# Patient Record
Sex: Male | Born: 1955 | Race: White | Hispanic: No | Marital: Married | Smoking: Never smoker
Health system: Southern US, Community
[De-identification: ages and names within clinical notes are randomized; demographics above are authoritative.]

## PROBLEM LIST (undated history)

## (undated) DIAGNOSIS — I10 Essential (primary) hypertension: Secondary | ICD-10-CM

## (undated) DIAGNOSIS — N289 Disorder of kidney and ureter, unspecified: Secondary | ICD-10-CM

## (undated) HISTORY — PX: JOINT REPLACEMENT: SHX530

---

## 2008-07-01 ENCOUNTER — Ambulatory Visit: Payer: Self-pay | Admitting: Gastroenterology

## 2008-11-18 ENCOUNTER — Ambulatory Visit: Payer: Self-pay | Admitting: Specialist

## 2008-11-26 ENCOUNTER — Inpatient Hospital Stay: Payer: Self-pay | Admitting: Specialist

## 2011-02-26 ENCOUNTER — Ambulatory Visit: Payer: Self-pay | Admitting: Internal Medicine

## 2013-06-26 ENCOUNTER — Ambulatory Visit: Payer: Self-pay | Admitting: Family Medicine

## 2013-08-02 ENCOUNTER — Ambulatory Visit: Payer: Self-pay | Admitting: Internal Medicine

## 2013-08-28 ENCOUNTER — Ambulatory Visit: Payer: Self-pay | Admitting: Specialist

## 2013-08-28 LAB — URINALYSIS, COMPLETE
BACTERIA: NONE SEEN
Bilirubin,UR: NEGATIVE
Blood: NEGATIVE
Glucose,UR: NEGATIVE mg/dL (ref 0–75)
Ketone: NEGATIVE
Leukocyte Esterase: NEGATIVE
Nitrite: NEGATIVE
Ph: 5 (ref 4.5–8.0)
Protein: NEGATIVE
SPECIFIC GRAVITY: 1.027 (ref 1.003–1.030)
Squamous Epithelial: NONE SEEN

## 2013-08-28 LAB — CBC
HCT: 46 % (ref 40.0–52.0)
HGB: 15.3 g/dL (ref 13.0–18.0)
MCH: 28.9 pg (ref 26.0–34.0)
MCHC: 33.2 g/dL (ref 32.0–36.0)
MCV: 87 fL (ref 80–100)
Platelet: 339 10*3/uL (ref 150–440)
RBC: 5.28 10*6/uL (ref 4.40–5.90)
RDW: 13.7 % (ref 11.5–14.5)
WBC: 9.6 10*3/uL (ref 3.8–10.6)

## 2013-08-28 LAB — BASIC METABOLIC PANEL
Anion Gap: 8 (ref 7–16)
BUN: 24 mg/dL — ABNORMAL HIGH (ref 7–18)
CHLORIDE: 99 mmol/L (ref 98–107)
Calcium, Total: 9.1 mg/dL (ref 8.5–10.1)
Co2: 30 mmol/L (ref 21–32)
Creatinine: 1.32 mg/dL — ABNORMAL HIGH (ref 0.60–1.30)
EGFR (African American): >60
GFR CALC NON AF AMER: 59 — AB
Glucose: 102 mg/dL — ABNORMAL HIGH (ref 65–99)
Osmolality: 278 (ref 275–301)
Potassium: 3.7 mmol/L (ref 3.5–5.1)
SODIUM: 137 mmol/L (ref 136–145)

## 2013-08-28 LAB — PROTIME-INR
INR: 1
PROTHROMBIN TIME: 12.6 s (ref 11.5–14.7)

## 2013-08-28 LAB — MRSA PCR SCREENING

## 2013-09-12 ENCOUNTER — Inpatient Hospital Stay: Payer: Self-pay | Admitting: Specialist

## 2013-09-13 LAB — CBC WITH DIFFERENTIAL/PLATELET
BASOS PCT: 0.4 %
Basophil #: 0 10*3/uL (ref 0.0–0.1)
Eosinophil #: 0.1 10*3/uL (ref 0.0–0.7)
Eosinophil %: 2 %
HCT: 37.4 % — AB (ref 40.0–52.0)
HGB: 12.4 g/dL — AB (ref 13.0–18.0)
Lymphocyte #: 0.9 10*3/uL — ABNORMAL LOW (ref 1.0–3.6)
Lymphocyte %: 14.7 %
MCH: 28.4 pg (ref 26.0–34.0)
MCHC: 33.2 g/dL (ref 32.0–36.0)
MCV: 86 fL (ref 80–100)
MONO ABS: 0.8 x10 3/mm (ref 0.2–1.0)
MONOS PCT: 12.7 %
Neutrophil #: 4.4 10*3/uL (ref 1.4–6.5)
Neutrophil %: 70.2 %
Platelet: 229 10*3/uL (ref 150–440)
RBC: 4.37 10*6/uL — ABNORMAL LOW (ref 4.40–5.90)
RDW: 13.8 % (ref 11.5–14.5)
WBC: 6.2 10*3/uL (ref 3.8–10.6)

## 2013-09-13 LAB — BASIC METABOLIC PANEL
Anion Gap: 6 — ABNORMAL LOW (ref 7–16)
BUN: 12 mg/dL (ref 7–18)
CALCIUM: 8.3 mg/dL — AB (ref 8.5–10.1)
CREATININE: 1.17 mg/dL (ref 0.60–1.30)
Chloride: 99 mmol/L (ref 98–107)
Co2: 30 mmol/L (ref 21–32)
EGFR (African American): >60
Glucose: 112 mg/dL — ABNORMAL HIGH (ref 65–99)
Osmolality: 271 (ref 275–301)
POTASSIUM: 3.7 mmol/L (ref 3.5–5.1)
Sodium: 135 mmol/L — ABNORMAL LOW (ref 136–145)

## 2013-09-14 LAB — HEMOGLOBIN: HGB: 12.7 g/dL — ABNORMAL LOW (ref 13.0–18.0)

## 2013-09-14 LAB — PATHOLOGY REPORT

## 2014-04-27 NOTE — Op Note (Signed)
PATIENT NAME:  Kevin Petty, BISCHOF MR#:  [id number] DATE OF BIRTH:  25-Jan-1955  DATE OF PROCEDURE:  09/12/2013  PREOPERATIVE DIAGNOSIS: Severe osteoarthritis, right hip.   POSTOPERATIVE DIAGNOSIS:  Severe osteoarthritis, right hip.   PROCEDURE PERFORMED: Depuy Porocoat AML total hip replacement (19.5-mm narrow femur/58-mm series 300 cup/+4/10-mm lipped polyethylene liner/36-mm head/+1.5-mm neck).   SURGEON: Valinda Hoar, MD   ASSISTANTS:  Clare Gandy, MD and Geanie Cooley, PA, student.   ANESTHESIA: Spinal plus general endotracheal.   COMPLICATIONS: None.   DRAINS: Two Autovacs.   ESTIMATED BLOOD LOSS: 500 mL; replaced, none.   DESCRIPTION OF PROCEDURE: The patient was brought to the Operating Room where he underwent satisfactory spinal anesthesia with narcotics and then a general endotracheal anesthesia. Because of his size, he is 6 feet 5 inches, 325 pounds, general endotracheal anesthesia was also inserted. He was turned into the left lateral decubitus position and positioned and padded appropriately. The right hip was prepped and draped in standard sterile fashion. A posterolateral incision was made and dissection was carried out sharply through subcutaneous tissue and through to the fascia. Charnley retractor was inserted and electrocautery was used for hemostasis. The short external rotators were divided and tagged. The posterior capsule was divided and tagged. The femoral head was dislocated and was amputated with an oscillating saw. Examination of the head showed severe deformity especially in the inferior half.  The labrum was completely debrided. Retractors were inserted to expose the acetabulum. The acetabulum was then reamed up to 56 mm.  Trial 56 and 58 liners were inserted and the 58 fit well and snugly. This was removed and after irrigation a series 300, 58-mm, three-spiked cup was inserted in 50 degrees of abduction and 20 degrees of anteversion. The femoral canal  was then exposed and canal finder inserted. Reamers were then inserted up to 19 mm. A grasper inserted up to the 19.5 narrow rasp, which was quite snug. Calcar rasp was used to smooth off the calcar. A trial reduction was carried out with a +1.5 hip ball, which reduced well and was stable. He had been about 1.5 cm short preoperatively and he was almost back to normal leg lengths with this neck length.  There, it was stable. Therefore, the trials were removed and a hole eliminator inserted into the acetabulum. A polyethylene liner with a +4 depth and a 10-mm posterior lip was inserted with the build-up posteriorly and superiorly. The 19.5 femoral stem was then inserted and fit quite snugly. A trial reduction was carried out and the hip was stable except for extreme internal rotation and adduction. The trials were removed and 36-mm head with a + 1.5-mm neck length was inserted. We did try to trial a +5-mm neck length, but we were unable to reduce this so we kept the +1.5-mm neck length. Once the final components were reduced and were stable throughout the range of motion, then the wound was irrigated. The Autovacs were inserted. The posterior capsule was closed with #2 Ticron. The short external rotators were repaired with the same suture. The deep fascia was closed with a #2 Quill suture over one drain and the subcutaneous tissue was closed with 2-0 Quill over another drain with irrigation at each level. The skin was closed with staples. Dry sterile dressings and TENS pads were applied. The Autovac was activated. The patient was rolled onto his back, onto the hospital bed and leg lengths were seen to be virtually equal. The patient was awakened from anesthesia  and taken to recovery in good condition.    ____________________________ Valinda HoarHoward E. Kamare Caspers, MD hem:lr D: 09/12/2013 10:55:10 ET T: 09/12/2013 11:28:14 ET JOB#: [id number]  cc: Valinda HoarHoward E. Samuel Rittenhouse, MD, <Dictator> Valinda HoarHOWARD E Garhett Bernhard MD ELECTRONICALLY SIGNED  09/12/2013 12:43

## 2014-04-27 NOTE — H&P (Signed)
   Subjective/Chief Complaint Right hip pain   History of Present Illness 59 year old male with right hip arthritis refractory to conservative treatment for several years.  Has had NSAIDs and exercise witout relief.  Had successful left total hip replacement and wishes to proceed with right.  X-rays show arthritis with spus, sclerosisi, and narrowing.   Code Status Full Code   Past Med/Surgical Hx:  MRSA (currently infected):   Gout:   Hypertension:   Tonsillectomy:   ALLERGIES:  Penicillin: Rash  Family and Social History:  Family History Non-Contributory   Social History negative tobacco   Place of Living Home   Review of Systems:  Subjective/Chief Complaint recent cellulitis left leg now cleared.   Fever/Chills No   Cough No   Sputum No   Abdominal Pain No   Physical Exam:  GEN well developed, well nourished   HEENT pink conjunctivae   NECK supple   RESP normal resp effort   CARD regular rate   ABD denies tenderness   LYMPH negative neck   EXTR negative edema, right hip pain with rotation.  circulation/sensation/motor function good.  minimal shortening.   SKIN normal to palpation   NEURO motor/sensory function intact   PSYCH alert, A+O to time, place, person    Assessment/Admission Diagnosis right hip osteoarthritis   Plan right hip total hip replacement   Electronic Signatures: Valinda HoarMiller, Minnetta Sandora E (MD)  (Signed 08-Sep-15 16:03)  Authored: CHIEF COMPLAINT and HISTORY, PAST MEDICAL/SURGIAL HISTORY, ALLERGIES, HOME MEDICATIONS, FAMILY AND SOCIAL HISTORY, REVIEW OF SYSTEMS, PHYSICAL EXAM, ASSESSMENT AND PLAN   Last Updated: 08-Sep-15 16:03 by Valinda HoarMiller, Blaiden Werth E (MD)

## 2014-04-27 NOTE — Discharge Summary (Signed)
PATIENT NAME:  Kevin Petty, Kevin Petty MR#:  [id number] DATE OF BIRTH:  1955-04-29  DATE OF ADMISSION:  09/12/2013 DATE OF DISCHARGE:  09/15/2013   FINAL DIAGNOSES:  1. Advanced osteoarthritis, right hip.  2. History of cellulitis.  3. History of gout.  4. Hypertension.   OPERATIONS: On 09/12/2013, DePuy AML Porocoated right total hip replacement.   COMPLICATIONS: None.   CONSULTATIONS: None.   DISCHARGE MEDICATIONS:  1. Norco 7.5/325 p.r.n. pain.  2. Enteric-coated aspirin 1 p.o. b.i.d. for 6 weeks.  3. Meloxicam 15 mg daily. 4. Gabapentin 400 mg b.i.d.  5. Hydrochlorothiazide 12.5 mg daily. 6. Gemfibrozil 600 mg b.i.d.  7. Allopurinol 300 mg daily.   HISTORY OF PRESENT ILLNESS: The patient is a 59 year old male with advanced osteoarthritis of the right hip. He is in excruciating pain and is unable to perform normal daily activities. He has pain with sitting, standing and walking. He has been on anti-inflammatories for several years. The pain has progressed to a point where he requests hip replacement surgery. He has had a satisfactory hip replacement on the left side 5 years ago which has done well.   PAST MEDICAL HISTORY:   ILLNESSES: As above.   MEDICATIONS: As above.   ALLERGIES: PENICILLIN.   OPERATIONS: Left total hip and tonsillectomy.  REVIEW OF SYSTEMS: The patient had some cellulitis in his left leg last month, which has cleared completely. His MRSA nasal swab was negative.   SOCIAL HISTORY: The patient lives at home. He works for [employer].   PHYSICAL EXAMINATION:  GENERAL: This is a pleasant, healthy, alert large male in no distress.  VITAL SIGNS: He is 6 feet 3 inches tall, 325.3 pounds.  HEART AND LUNGS: Normal.  MUSCULOSKELETAL: The left hip shows a well-healed incision with excellent range of motion of the hip without pain. Leg lengths show about 1.5 cm shortening on the right. He has severe pain with right hip range of motion. He has very limited range  of motion with internal rotation of 5 degrees, external rotation of 15 degrees. Neurovascular status was good distally.   LABORATORY DATA ON ADMISSION: Satisfactory.   HOSPITAL COURSE: On 09/12/2013, the patient underwent satisfactory Porocoated AML right total hip replacement. Postoperatively, he did very well with no significant problems. His hemoglobin was 12.7 on the second postop day. His pain was markedly improved. He was ambulatory and ready for discharge on 09/15/2013. He will get home health PT for 2 weeks and then be seen in my office for exam and x-rays. He will remain out of work.   ____________________________ Valinda HoarHoward E. Akeem Heppler, MD hem:lb D: 09/15/2013 09:54:04 ET T: 09/15/2013 10:28:08 ET JOB#: [id number]  cc: Valinda HoarHoward E. Burdette Gergely, MD, <Dictator> John B. Danne HarborWalker III, MD Valinda HoarHOWARD E Fynlee Rowlands MD ELECTRONICALLY SIGNED 09/15/2013 11:49

## 2014-10-08 IMAGING — CT CT PELVIS W/ CM
2 of 3 series · 17 of 46 positions shown, 19 images · IV contrast (isovue)
Comparison: Ultrasound June 26, 2013.

CLINICAL DATA: Left groin cellulitis.

EXAM:
CT PELVIS WITH CONTRAST
TECHNIQUE: Multidetector CT imaging of the pelvis was performed using the
standard protocol following the bolus administration of intravenous
contrast.
CONTRAST:  100 mL of Isovue 370 intravenously.

[Series 2: routine pel with · axial · 0.96mm/px · z∈[-1314,-934]mm · 14 of 88 slices shown, 16 images]
[im 6/88  soft-tissue]
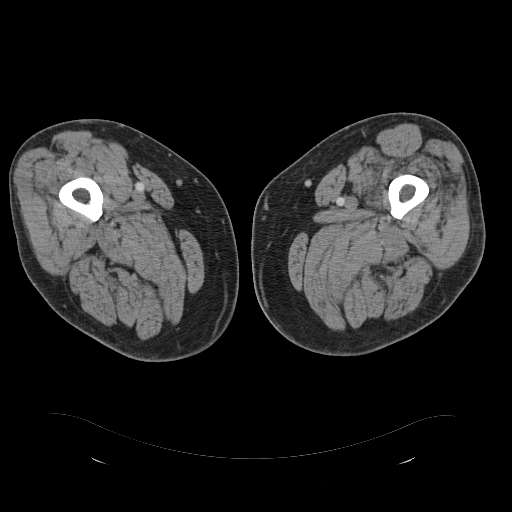
[im 6/88  bone]
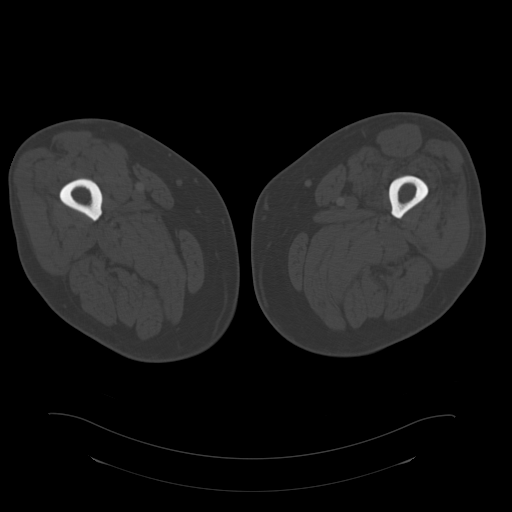
[im 12/88  soft-tissue]
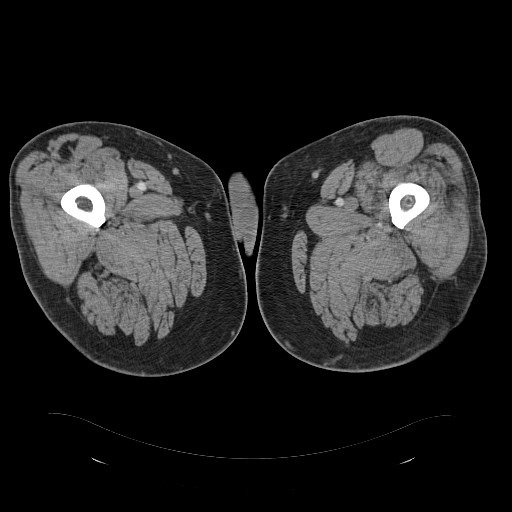
[im 17/88  soft-tissue]
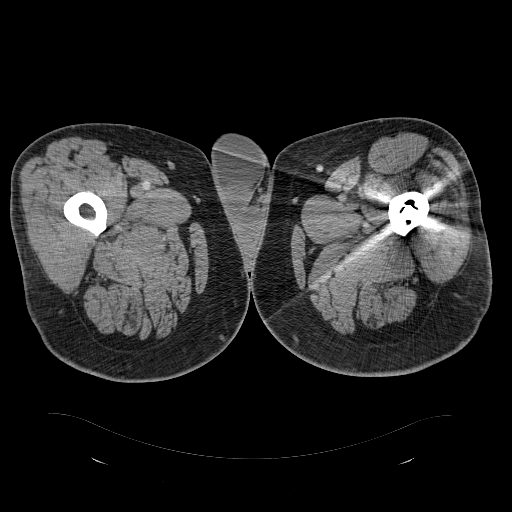
[im 23/88  soft-tissue]
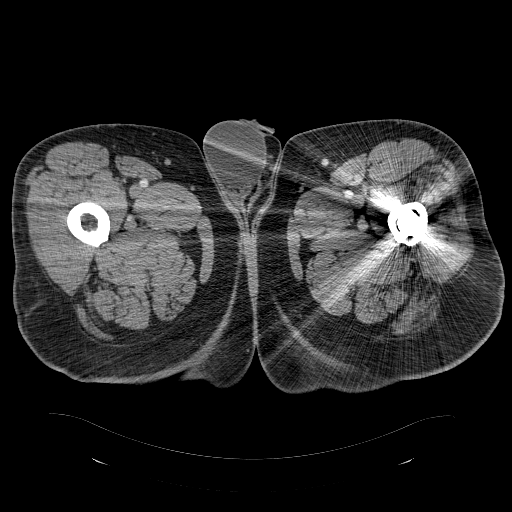
[im 29/88  soft-tissue]
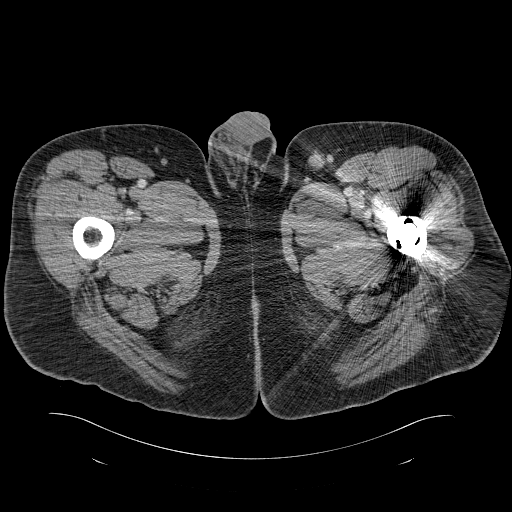
[im 34/88  soft-tissue]
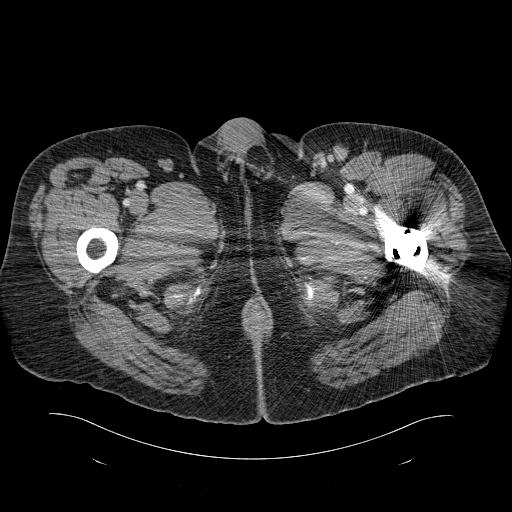
[im 40/88  soft-tissue]
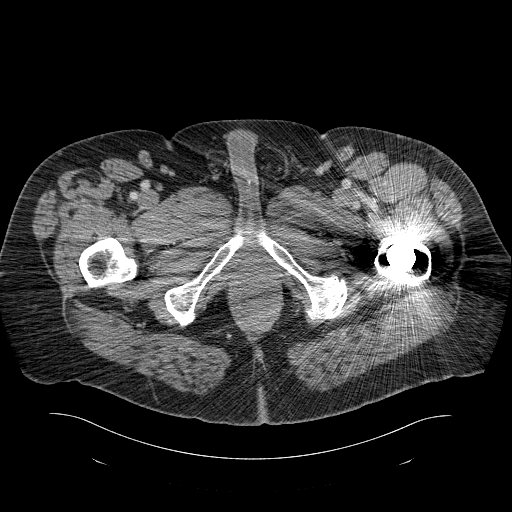
[im 48/88  soft-tissue]
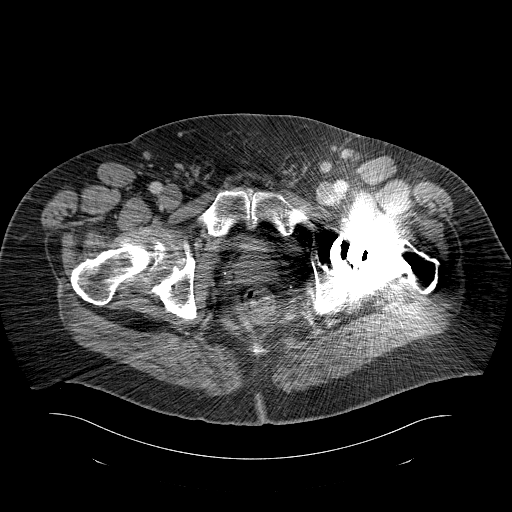
[im 54/88  soft-tissue]
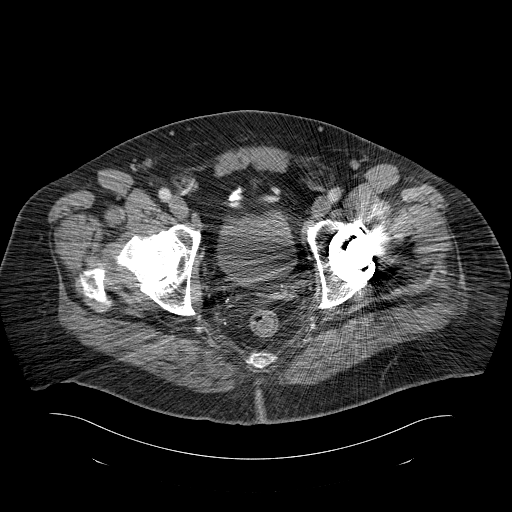
[im 54/88  bone]
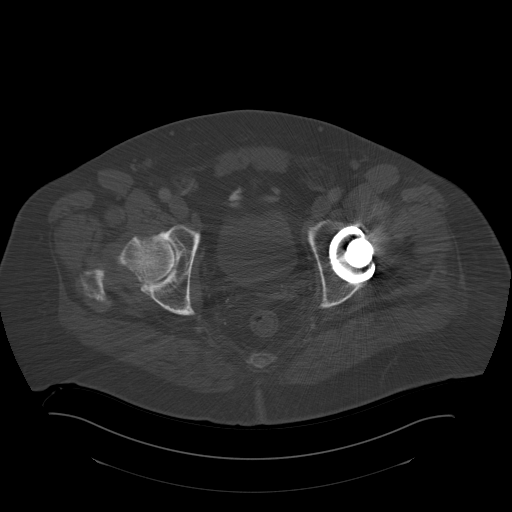
[im 59/88  soft-tissue]
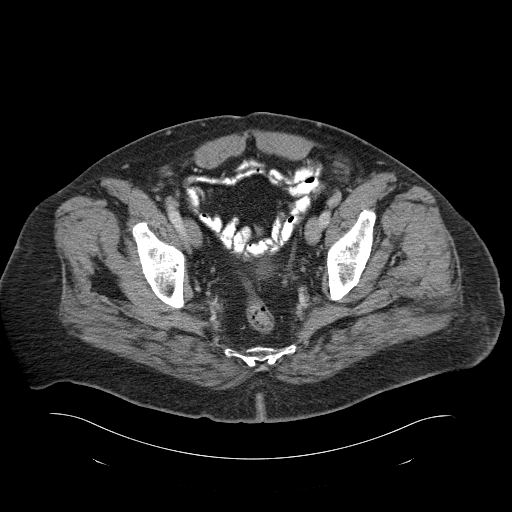
[im 65/88  soft-tissue]
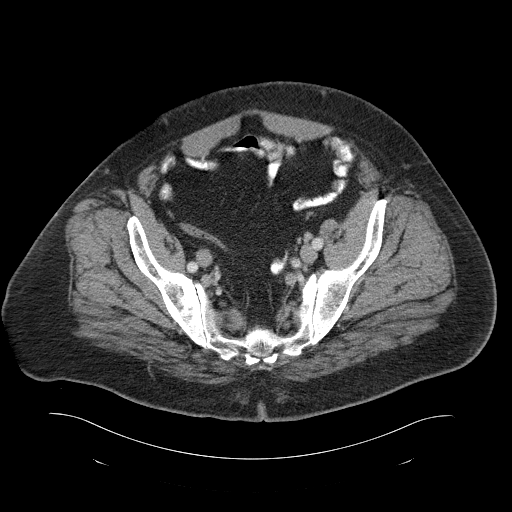
[im 71/88  soft-tissue]
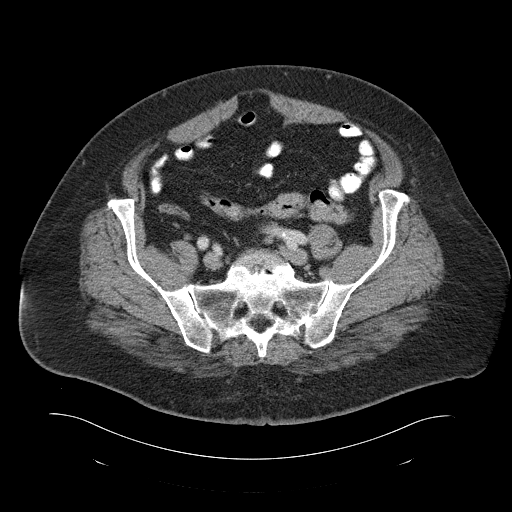
[im 76/88  soft-tissue]
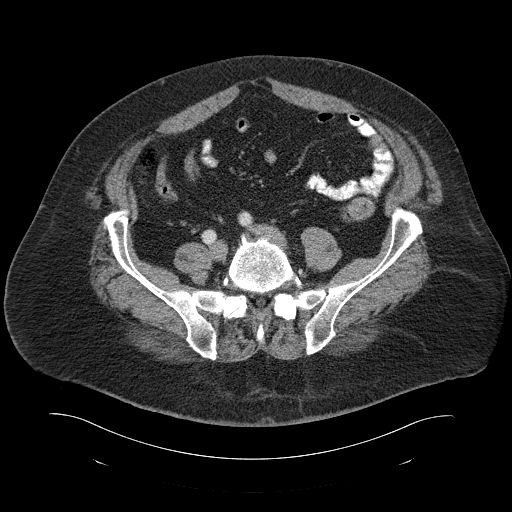
[im 82/88  soft-tissue]
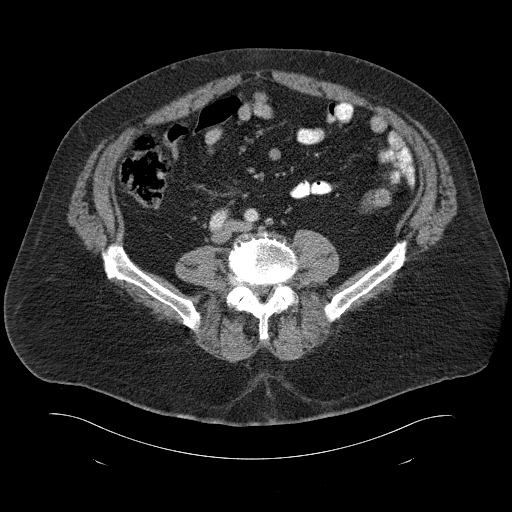

[Series 4: cor routine pel with · coronal · 0.88mm/px · 3 of 171 slices shown]
[im 57/171  soft-tissue]
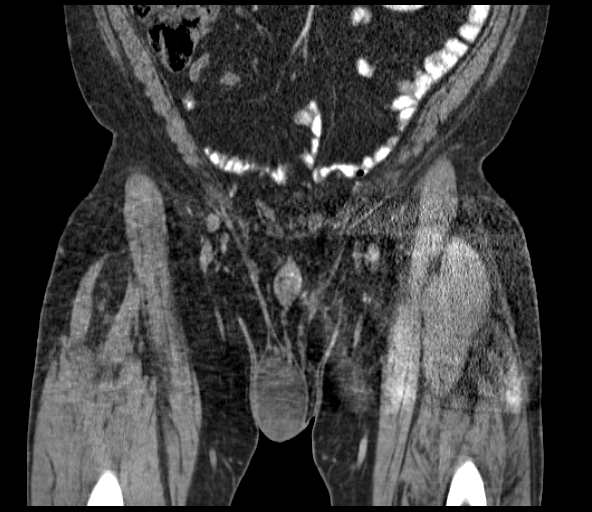
[im 76/171  soft-tissue]
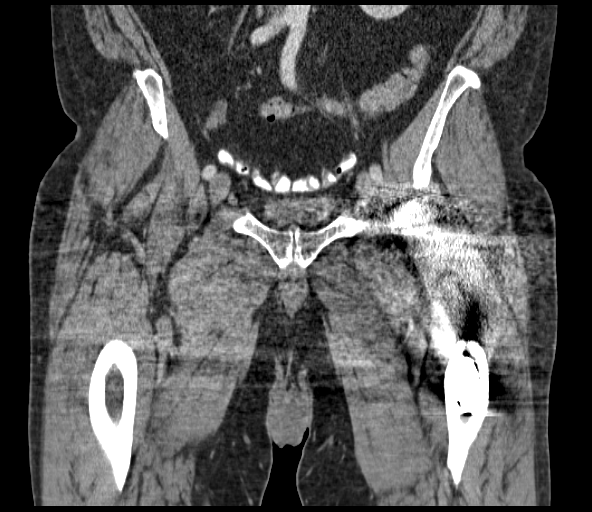
[im 95/171  soft-tissue]
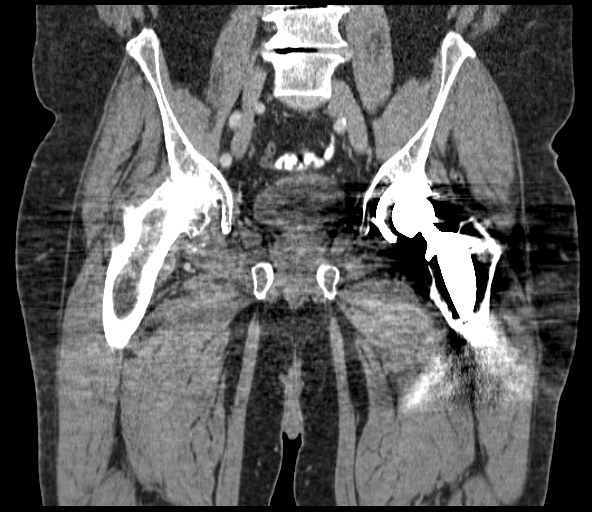

[17 of 46 positions shown; findings below may reference images not displayed]

FINDINGS: Status post left total hip arthroplasty which generates significant
scatter artifact and obscures evaluation of the left side of the
pelvis. Severe degenerative disc disease is noted at L4-5. Severe
narrowing of the right hip joint is noted with probable avascular
necrosis seen in the right femoral head. Mild fat containing left
inguinal hernia is noted. No definite bowel abnormality is noted. No
abnormal fluid collection is noted. Urinary bladder appears normal.
Enlarged bilateral inguinal lymph nodes are noted with the largest
measuring 27 x 20 mm in the left inguinal region. These most likely
are inflammatory in origin, but neoplasm cannot be excluded. No
definite abscess is noted.
IMPRESSION: Severe degenerative joint disease of right hip is noted with
probable avascular necrosis noted in right femoral head.

Enlarged bilateral inguinal lymph nodes are noted with the largest
measuring 2.7 x 2.0 cm on the left side. These most likely are
inflammatory in origin, but neoplasm cannot be excluded. As
mentioned on prior ultrasound report, biopsy may be considered.

No abscess is noted.

## 2014-11-18 IMAGING — CR PELVIS - 1-2 VIEW
1 series · 2 of 2 positions shown · non-contrast
Comparison: None.

CLINICAL DATA: Postop right hip replacement

EXAM:
PELVIS - 1-2 VIEW; RIGHT HIP - 1 VIEW

[Series 1: ap · 0.17mm/px · 2 of 2 slices shown]
[im 1/2]
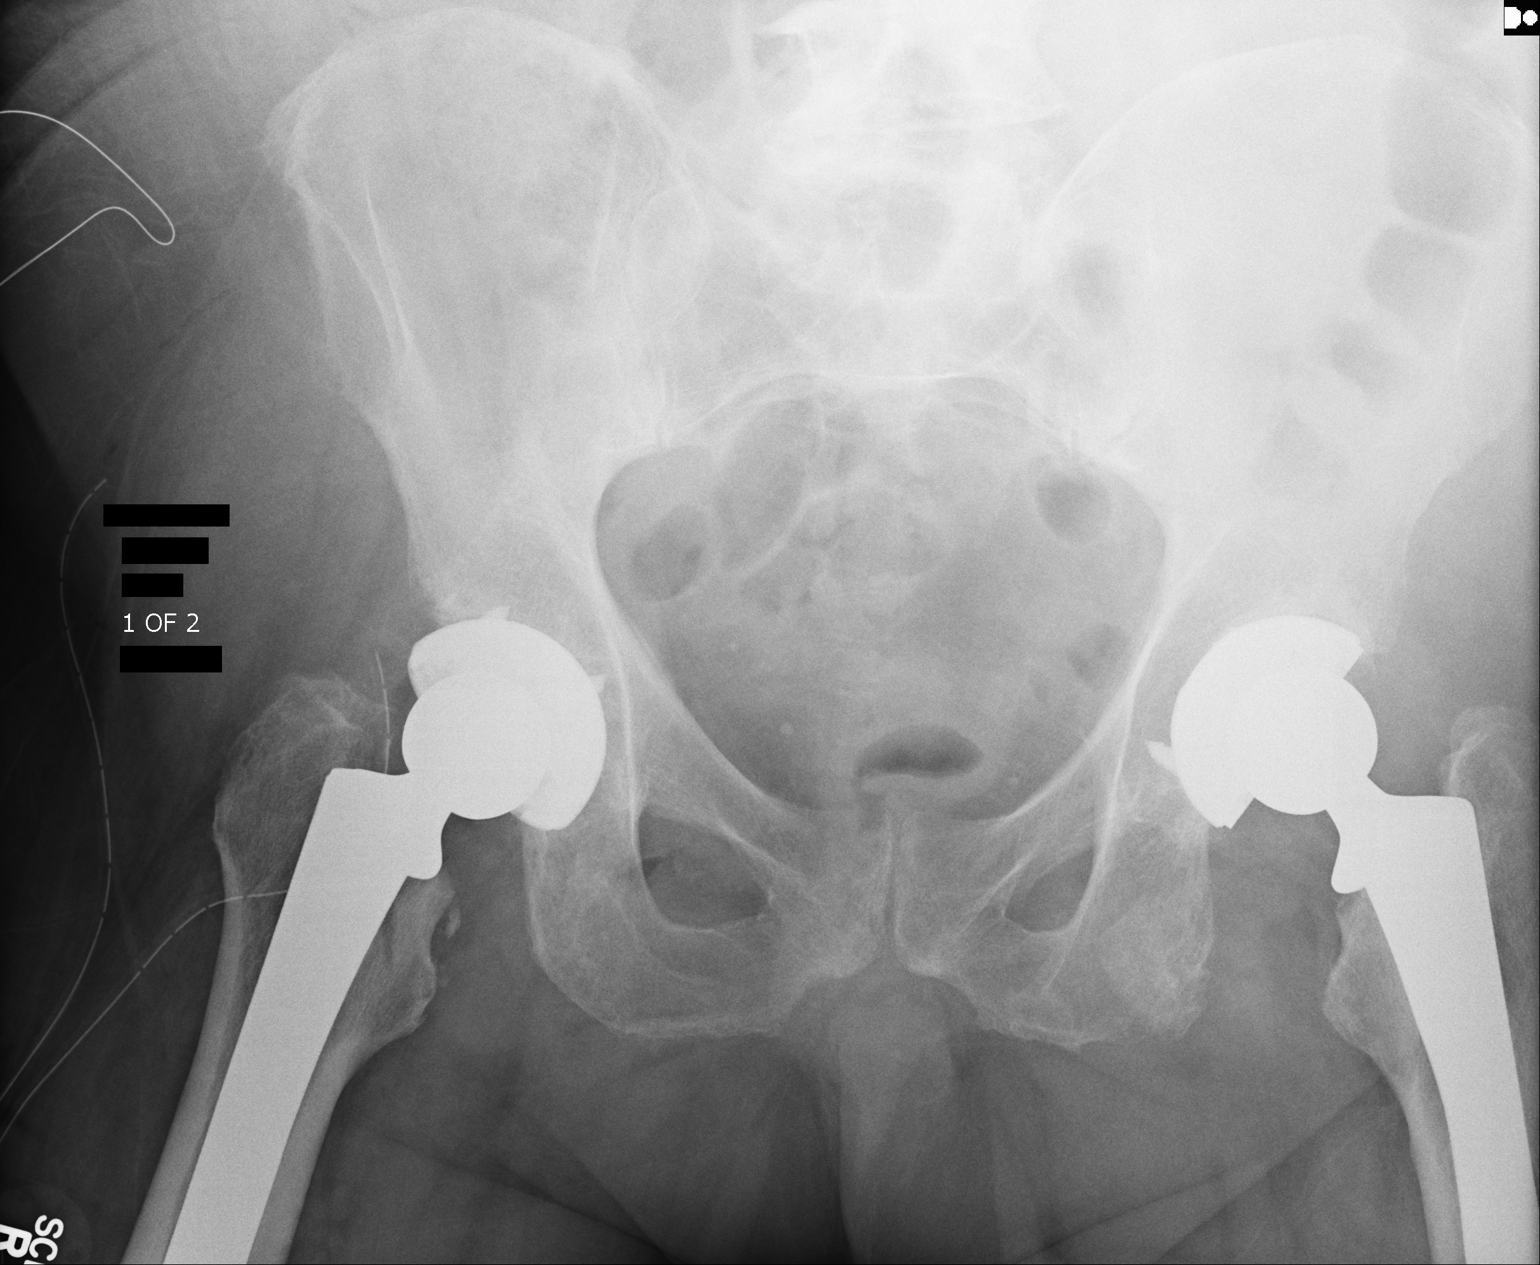
[im 2/2]
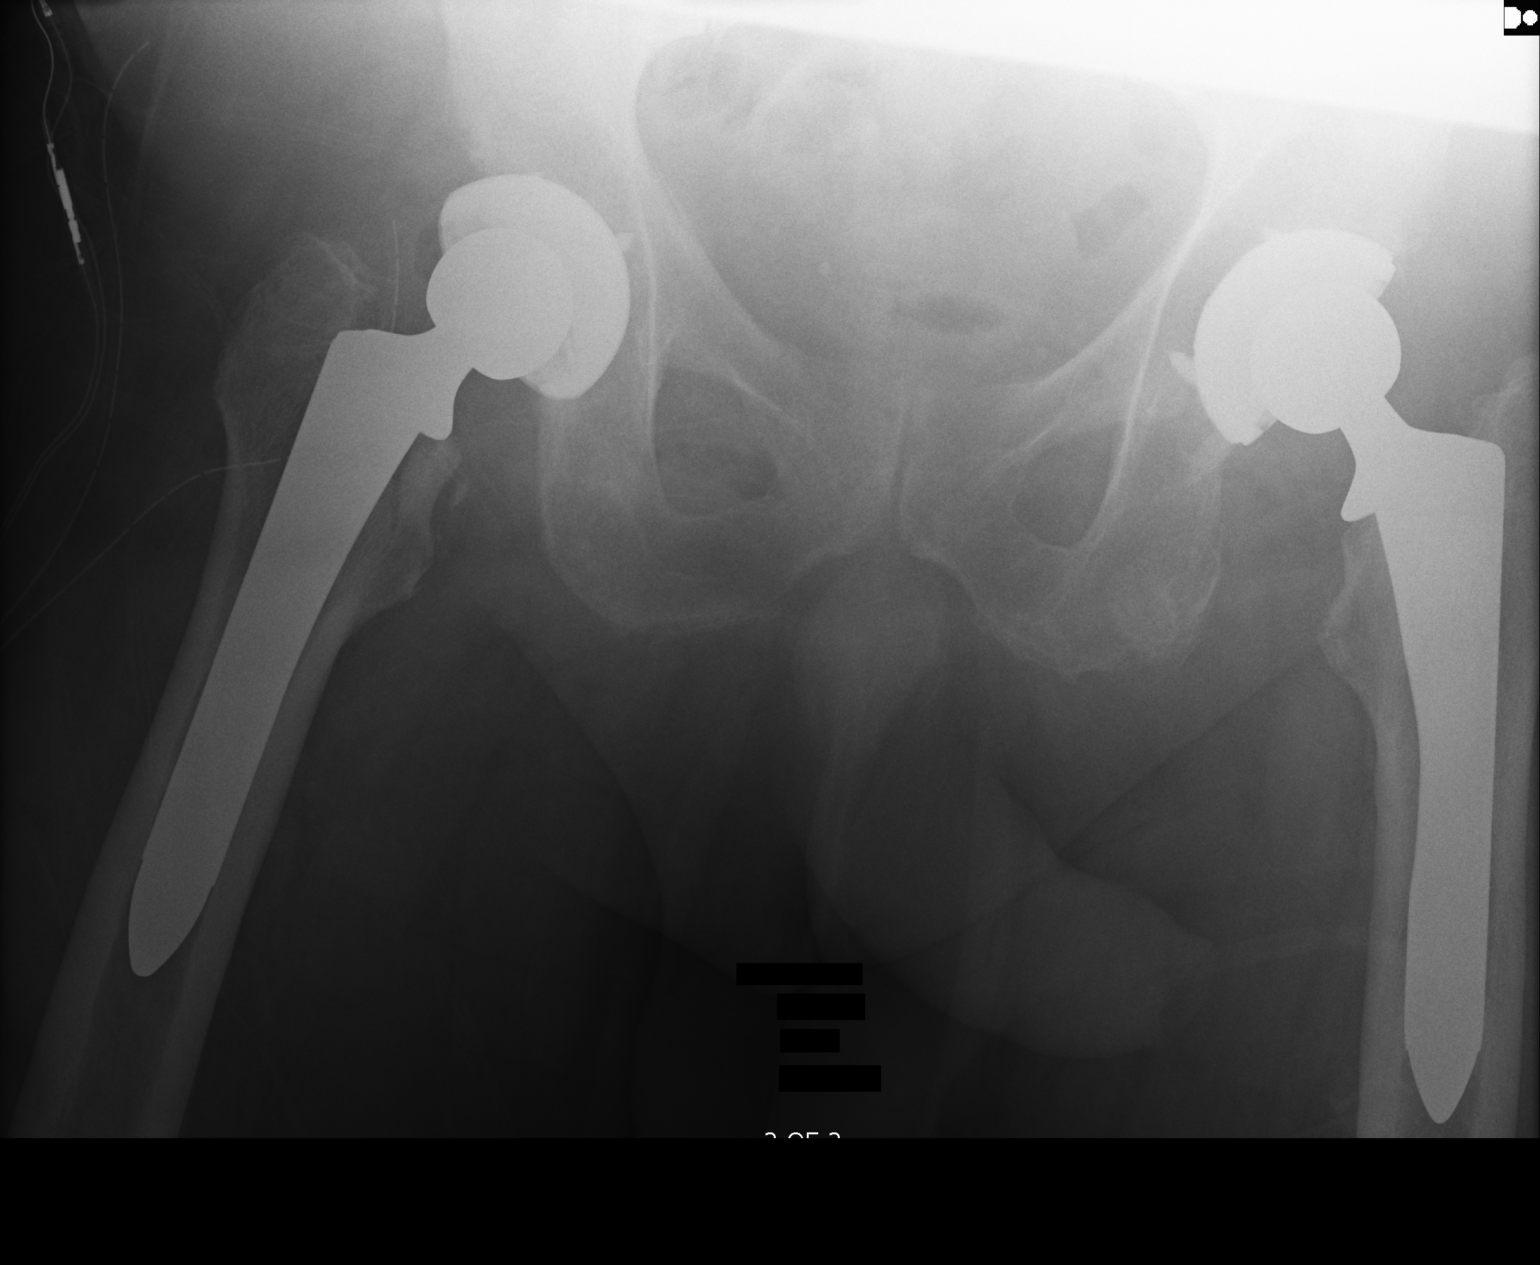

[2 of 2 positions shown; findings below may reference images not displayed]

FINDINGS: Interval right hip arthroplasty without failure or complication. No
fracture or dislocation. Postsurgical changes in the surrounding
soft tissues with surgical drains present.

Left hip arthroplasty without failure or complication.
IMPRESSION: Right hip arthroplasty without failure or complication.

## 2018-08-13 ENCOUNTER — Emergency Department: Payer: BC Managed Care – PPO

## 2018-08-13 ENCOUNTER — Encounter: Payer: Self-pay | Admitting: Emergency Medicine

## 2018-08-13 ENCOUNTER — Other Ambulatory Visit: Payer: Self-pay

## 2018-08-13 ENCOUNTER — Emergency Department
Admission: EM | Admit: 2018-08-13 | Discharge: 2018-08-13 | Disposition: A | Discharge status: Home / Self Care | Payer: BC Managed Care – PPO | Attending: Emergency Medicine | Admitting: Emergency Medicine

## 2018-08-13 DIAGNOSIS — S86891A Other injury of other muscle(s) and tendon(s) at lower leg level, right leg, initial encounter: Secondary | ICD-10-CM | POA: Diagnosis not present

## 2018-08-13 DIAGNOSIS — S80911A Unspecified superficial injury of right knee, initial encounter: Secondary | ICD-10-CM | POA: Diagnosis present

## 2018-08-13 DIAGNOSIS — Y939 Activity, unspecified: Secondary | ICD-10-CM | POA: Insufficient documentation

## 2018-08-13 DIAGNOSIS — Y929 Unspecified place or not applicable: Secondary | ICD-10-CM | POA: Diagnosis not present

## 2018-08-13 DIAGNOSIS — W109XXA Fall (on) (from) unspecified stairs and steps, initial encounter: Secondary | ICD-10-CM | POA: Insufficient documentation

## 2018-08-13 DIAGNOSIS — Y999 Unspecified external cause status: Secondary | ICD-10-CM | POA: Insufficient documentation

## 2018-08-13 DIAGNOSIS — M25561 Pain in right knee: Secondary | ICD-10-CM

## 2018-08-13 HISTORY — DX: Essential (primary) hypertension: I10

## 2018-08-13 MED ORDER — TRAMADOL HCL 50 MG PO TABS: 50.0000 mg | ORAL_TABLET | Freq: Four times a day (QID) | ORAL | 0 refills | Status: DC | PRN

## 2018-08-13 NOTE — Consult Note (Signed)
ORTHOPAEDIC CONSULTATION  REQUESTING PHYSICIAN: Shaune PollackIsaacs, Cameron, MD  Chief Complaint: Right knee pain  HPI: Called by Greig RightSusan Fisher, PA regarding Mr.Donnita FallsOsburne M Bencivenga, a 63 y.o. male who complains of right knee pain and difficulty extending his knee after he slipped down several stairs today.  I reviewed x-rays which showed advanced tricompartmental osteoarthritis.  Patient has stated he has a history of a patella fracture.  On today's x-ray the lateral view shows calcification proximal to the patella which does not appear to be a fracture of the patella itself but may be an avulsion of an osteophyte consistent with a quadriceps tendon tear.  I have recommended MRI evaluation of the right knee.  The patient will then be placed in a knee immobilizer.  He may weight-bear on the right lower extremity using crutches or a walker and will follow-up with me in the office early next week to review the MRI results.  If a quadriceps rupture is confirmed, surgery can be arranged during his office visit.    Past Medical History:  Diagnosis Date  . Hypertension     Social History   Socioeconomic History  . Marital status: Married    Spouse name: Not on file  . Number of children: Not on file  . Years of education: Not on file  . Highest education level: Not on file  Occupational History  . Not on file  Social Needs  . Financial resource strain: Not on file  . Food insecurity    Worry: Not on file    Inability: Not on file  . Transportation needs    Medical: Not on file    Non-medical: Not on file  Tobacco Use  . Smoking status: Never Smoker  . Smokeless tobacco: Never Used  Substance and Sexual Activity  . Alcohol use: Not on file  . Drug use: Not on file  . Sexual activity: Not on file  Lifestyle  . Physical activity    Days per week: Not on file    Minutes per session: Not on file  . Stress: Not on file  Relationships  . Social Musicianconnections    Talks on phone: Not on file    Gets  together: Not on file    Attends religious service: Not on file    Active member of club or organization: Not on file    Attends meetings of clubs or organizations: Not on file    Relationship status: Not on file  Other Topics Concern  . Not on file  Social History Narrative  . Not on file   No family history on file. Allergies  Allergen Reactions  . Penicillins Rash   Prior to Admission medications   Medication Sig Start Date End Date Taking? Authorizing Provider  traMADol (ULTRAM) 50 MG tablet Take 1 tablet (50 mg total) by mouth every 6 (six) hours as needed. 08/13/18   Faythe GheeFisher, Susan W, PA-C   Dg Knee Complete 4 Views Right  Result Date: 08/13/2018 CLINICAL DATA:  Knee pain and swelling post fall. EXAM: RIGHT KNEE - COMPLETE 4+ VIEW COMPARISON:  None. FINDINGS: No evidence of fracture, or dislocation or sizable joint effusion. Prepatellar/suprapatellar soft tissue swelling. 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 IMPRESSION: 1. No acute fracture or dislocation identified about the right Knee. 2. Prepatellar/suprapatellar soft tissue swelling. 3. Three compartment moderate to advanced arthritic changes. 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 Electronically Signed  By: Fidela Salisbury M.D.   On: 08/13/2018 13:53       Thornton Park, MD    08/13/2018 2:10 PM

## 2018-08-13 NOTE — ED Provider Notes (Addendum)
[facility] Emergency Department Provider Note  ____________________________________________   First MD Initiated Contact with Patient 08/13/18 1213     (approximate)  I have reviewed the triage vital signs and the nursing notes.   HISTORY  Chief Complaint Knee Pain    HPI Kevin Petty is a 63 y.o. male presents emergency department complaining of right knee pain after a fall when he slipped down several steps.  He states it is difficult to extend his right leg.  He is able to bear weight but causes pain.  States he felt something snap.  He thinks he tore a tendon or ligament.  He states he has had a break at the patella before.  He is unsure of what magnitude the fracture was.  [facility] is a patient at [facility].    Past Medical History:  Diagnosis Date  . Hypertension     There are no active problems to display for this patient.     Prior to Admission medications   Medication Sig Start Date End Date Taking? Authorizing Provider  traMADol (ULTRAM) 50 MG tablet Take 1 tablet (50 mg total) by mouth every 6 (six) hours as needed. 08/13/18   Faythe GheeFisher,  W, PA-C    Allergies Penicillins  No family history on file.  Social History Social History   Tobacco Use  . Smoking status: Never Smoker  . Smokeless tobacco: Never Used  Substance Use Topics  . Alcohol use: Not on file  . Drug use: Not on file    Review of Systems  Constitutional: No fever/chills Eyes: No visual changes. ENT: No sore throat. Respiratory: Denies cough Genitourinary: Negative for dysuria. Musculoskeletal: Negative for back pain.  Positive for right knee pain Skin: Negative for rash.    ____________________________________________   PHYSICAL EXAM:  VITAL SIGNS: ED Triage Vitals  Enc Vitals Group     BP 08/13/18 1204 (!) 144/73     Pulse Rate 08/13/18 1204 74     Resp 08/13/18 1204 18     Temp 08/13/18 1204 98.6 F (37 C)     Temp Source  08/13/18 1204 Oral     SpO2 08/13/18 1204 98 %     Weight 08/13/18 1203 (!) 305 lb (138.3 kg)     Height 08/13/18 1203 6\' 3"  (1.905 m)     Head Circumference --      Peak Flow --      Pain Score 08/13/18 1203 5     Pain Loc --      Pain Edu? --      Excl. in GC? --     Constitutional: Alert and oriented. Well appearing and in no acute distress. Eyes: Conjunctivae are normal.  Head: Atraumatic. Nose: No congestion/rhinnorhea. Mouth/Throat: Mucous membranes are moist.   Neck:  supple no lymphadenopathy noted Cardiovascular: Normal rate, regular rhythm.  Respiratory: Normal respiratory effort.  No retractions, GU: deferred Musculoskeletal: Decreased range of motion of the right knee.  Patient has difficulty with extension.  The medial aspect of the right knee is tender to palpation.  Neurovascular is intact.   Neurologic:  Normal speech and language.  Skin:  Skin is warm, dry and intact. No rash noted. Psychiatric: Mood and affect are normal. Speech and behavior are normal.  ____________________________________________   LABS (all labs ordered are listed, but only abnormal results are displayed)  Labs Reviewed - No data to display ____________________________________________   ____________________________________________  RADIOLOGY  X-ray of the right knee  does not show an acute abnormality MRI of the right knee ____________________________________________   PROCEDURES  Procedure(s) performed: Knee immobilizer applied by nursing staff  Procedures    ____________________________________________   INITIAL IMPRESSION / ASSESSMENT AND PLAN / ED COURSE  Pertinent labs & imaging results that were available during my care of the patient were reviewed by me and considered in my medical decision making (see chart for details).   Patient 63 year old male presents emergency department complaint of right knee pain after a fall.  Physical exam shows the right knee to be  swollen and tender at the medial aspect, decreased range of motion with extension.  Neurovascular intact.  X-ray of the right knee does not show an acute abnormality per the radiologist, however the top part of the patella appears to be avulsed off.  Paged Dr. Mack Guise.  Explained the situation where the patient is having difficulty extending his right leg.  He states to put the patient in a knee immobilizer.  He has not take the knee immobilizer off while trying to bear weight.  He should wear this at all times.  He is to follow-up in his office this week.  Pain medication as needed.  Elevate and ice.  Explained all of this to the patient.  He agrees to treatment plan.  He is given prescription for tramadol.  He is to take Tylenol and ibuprofen for pain as needed.  Tramadol for pain not controlled by the other over-the-counter medications.  And he will call Dr. Harden Mo office tomorrow morning for an appointment this week.  He was discharged in stable condition with strict instructions to not bear weight on the right leg unless he has an knee immobilizer on.  He states he understands.    Kevin Petty was evaluated in Emergency Department on 08/13/2018 for the symptoms described in the history of present illness. He was evaluated in the context of the global COVID-19 pandemic, which necessitated consideration that the patient might be at risk for infection with the SARS-CoV-2 virus that causes COVID-19. Institutional protocols and algorithms that pertain to the evaluation of patients at risk for COVID-19 are in a state of rapid change based on information released by regulatory bodies including the CDC and federal and state organizations. These policies and algorithms were followed during the patient's care in the ED. ----------------------------------------- 2:15 PM on 08/13/2018 -----------------------------------------  Dr. Mack Guise called back and like for [place] to do the MRI today.  MRI of  the right knee is ordered. Patient returned from MRI and is requesting to leave as he states the results would be and in 1 and half hours per the tech.  Explained to him we could discharge him and I can call him back with the results.   As part of my medical decision making, I reviewed the following data within the [facility] notes reviewed and incorporated, Old chart reviewed, Radiograph reviewed x-ray of the right knee does not show any acute abnormality, A consult was requested and obtained from this/these consultant(s) Orthopedics, Notes from prior ED visits and [place] Controlled Substance Database  ____________________________________________   FINAL CLINICAL IMPRESSION(S) / ED DIAGNOSES  Final diagnoses:  Avulsion of right patellar tendon, initial encounter  Acute pain of right knee      NEW MEDICATIONS STARTED DURING THIS VISIT:  Discharge Medication List as of 08/13/2018  2:01 PM    START taking these medications   Details  traMADol (ULTRAM) 50 MG tablet Take 1  tablet (50 mg total) by mouth every 6 (six) hours as needed., Starting Sun 08/13/2018, Normal         Note:  This document was prepared using Dragon voice recognition software and may include unintentional dictation errors.    Faythe GheeFisher,  W, PA-C 08/13/18 1403    Faythe GheeFisher,  W, PA-C 08/13/18 1621    Shaune PollackIsaacs, Cameron, MD 08/19/18 1046

## 2018-08-13 NOTE — Discharge Instructions (Addendum)
Wear the knee immobilizer at all times when trying to bear weight on your right leg.  Apply ice to the right knee.  Take Tylenol and ibuprofen for pain as needed.  Tramadol for pain not controlled by Tylenol or ibuprofen.  Call Dr. Harden Mo office tomorrow for an appointment.  Return to the emergency department if worsening.  Use your cane and/or walker to help give you support.

## 2018-08-13 NOTE — ED Triage Notes (Signed)
Pt arrived via POV with wife reports right knee pain after fall when he slipped. Pt reports it is difficult to walk on and bend.    Pt using cane in triage, walking with limp.

## 2018-08-15 ENCOUNTER — Other Ambulatory Visit: Payer: Self-pay | Admitting: Orthopedic Surgery

## 2018-08-15 DIAGNOSIS — S76191A Other specified injury of right quadriceps muscle, fascia and tendon, initial encounter: Secondary | ICD-10-CM

## 2018-08-21 ENCOUNTER — Other Ambulatory Visit
Admission: RE | Admit: 2018-08-21 | Discharge: 2018-08-21 | Disposition: A | Discharge status: Home / Self Care | Payer: BC Managed Care – PPO | Source: Ambulatory Visit | Attending: Orthopedic Surgery | Admitting: Orthopedic Surgery

## 2018-08-21 ENCOUNTER — Other Ambulatory Visit: Payer: Self-pay | Admitting: Orthopedic Surgery

## 2018-08-21 ENCOUNTER — Other Ambulatory Visit: Payer: Self-pay

## 2018-08-21 DIAGNOSIS — Z20828 Contact with and (suspected) exposure to other viral communicable diseases: Secondary | ICD-10-CM | POA: Diagnosis not present

## 2018-08-21 DIAGNOSIS — Z01812 Encounter for preprocedural laboratory examination: Secondary | ICD-10-CM | POA: Insufficient documentation

## 2018-08-21 MED ORDER — VANCOMYCIN HCL 10 G IV SOLR
1500.0000 mg | INTRAVENOUS | Status: AC
  Administered 2018-08-22: 1500 mg via INTRAVENOUS
  Filled 2018-08-21: qty 1500

## 2018-08-22 ENCOUNTER — Encounter
Admission: RE | Disposition: A | Discharge status: Home Health | Payer: Self-pay | Source: Home / Self Care | Attending: Orthopedic Surgery

## 2018-08-22 ENCOUNTER — Other Ambulatory Visit: Payer: Self-pay

## 2018-08-22 ENCOUNTER — Observation Stay: Payer: BC Managed Care – PPO

## 2018-08-22 ENCOUNTER — Encounter: Payer: Self-pay | Admitting: *Deleted

## 2018-08-22 ENCOUNTER — Ambulatory Visit: Payer: BC Managed Care – PPO | Admitting: Anesthesiology

## 2018-08-22 ENCOUNTER — Ambulatory Visit: Payer: BC Managed Care – PPO

## 2018-08-22 ENCOUNTER — Observation Stay
Admission: RE | Admit: 2018-08-22 | Discharge: 2018-08-23 | Disposition: A | Discharge status: Home Health | Payer: BC Managed Care – PPO | Attending: Orthopedic Surgery | Admitting: Orthopedic Surgery

## 2018-08-22 DIAGNOSIS — X58XXXA Exposure to other specified factors, initial encounter: Secondary | ICD-10-CM | POA: Diagnosis not present

## 2018-08-22 DIAGNOSIS — Z86718 Personal history of other venous thrombosis and embolism: Secondary | ICD-10-CM | POA: Insufficient documentation

## 2018-08-22 DIAGNOSIS — S76111A Strain of right quadriceps muscle, fascia and tendon, initial encounter: Secondary | ICD-10-CM | POA: Diagnosis not present

## 2018-08-22 DIAGNOSIS — W109XXA Fall (on) (from) unspecified stairs and steps, initial encounter: Secondary | ICD-10-CM | POA: Diagnosis not present

## 2018-08-22 DIAGNOSIS — Z79899 Other long term (current) drug therapy: Secondary | ICD-10-CM | POA: Insufficient documentation

## 2018-08-22 DIAGNOSIS — Z880 Allergy status to penicillin: Secondary | ICD-10-CM | POA: Diagnosis not present

## 2018-08-22 DIAGNOSIS — E669 Obesity, unspecified: Secondary | ICD-10-CM | POA: Diagnosis not present

## 2018-08-22 DIAGNOSIS — Y939 Activity, unspecified: Secondary | ICD-10-CM | POA: Diagnosis not present

## 2018-08-22 DIAGNOSIS — I10 Essential (primary) hypertension: Secondary | ICD-10-CM | POA: Insufficient documentation

## 2018-08-22 DIAGNOSIS — N433 Hydrocele, unspecified: Secondary | ICD-10-CM | POA: Diagnosis not present

## 2018-08-22 DIAGNOSIS — Z7901 Long term (current) use of anticoagulants: Secondary | ICD-10-CM | POA: Diagnosis not present

## 2018-08-22 DIAGNOSIS — Z6838 Body mass index (BMI) 38.0-38.9, adult: Secondary | ICD-10-CM | POA: Diagnosis not present

## 2018-08-22 DIAGNOSIS — Z419 Encounter for procedure for purposes other than remedying health state, unspecified: Secondary | ICD-10-CM

## 2018-08-22 HISTORY — PX: QUADRICEPS TENDON REPAIR: SHX756

## 2018-08-22 LAB — CBC WITH DIFFERENTIAL/PLATELET
Abs Immature Granulocytes: 0.02 10*3/uL (ref 0.00–0.07)
Basophils Absolute: 0 10*3/uL (ref 0.0–0.1)
Basophils Relative: 1 %
Eosinophils Absolute: 0.3 10*3/uL (ref 0.0–0.5)
Eosinophils Relative: 5 %
HCT: 38.9 % — ABNORMAL LOW (ref 39.0–52.0)
Hemoglobin: 13.3 g/dL (ref 13.0–17.0)
Immature Granulocytes: 0 %
Lymphocytes Relative: 20 %
Lymphs Abs: 1.4 10*3/uL (ref 0.7–4.0)
MCH: 29.5 pg (ref 26.0–34.0)
MCHC: 34.2 g/dL (ref 30.0–36.0)
MCV: 86.3 fL (ref 80.0–100.0)
Monocytes Absolute: 0.6 10*3/uL (ref 0.1–1.0)
Monocytes Relative: 9 %
Neutro Abs: 4.6 10*3/uL (ref 1.7–7.7)
Neutrophils Relative %: 65 %
Platelets: 318 10*3/uL (ref 150–400)
RBC: 4.51 MIL/uL (ref 4.22–5.81)
RDW: 12.2 % (ref 11.5–15.5)
WBC: 6.9 10*3/uL (ref 4.0–10.5)
nRBC: 0 % (ref 0.0–0.2)

## 2018-08-22 LAB — BASIC METABOLIC PANEL
Anion gap: 8 (ref 5–15)
BUN: 20 mg/dL (ref 8–23)
CO2: 27 mmol/L (ref 22–32)
Calcium: 8.7 mg/dL — ABNORMAL LOW (ref 8.9–10.3)
Chloride: 100 mmol/L (ref 98–111)
Creatinine, Ser: 1.07 mg/dL (ref 0.61–1.24)
GFR calc Af Amer: >60 mL/min (ref >60)
GFR calc non Af Amer: >60 mL/min (ref >60)
Glucose, Bld: 117 mg/dL — ABNORMAL HIGH (ref 70–99)
Potassium: 3.4 mmol/L — ABNORMAL LOW (ref 3.5–5.1)
Sodium: 135 mmol/L (ref 135–145)

## 2018-08-22 LAB — PROTIME-INR
INR: 1.2 (ref 0.8–1.2)
Prothrombin Time: 14.8 seconds (ref 11.4–15.2)

## 2018-08-22 LAB — APTT: aPTT: 33 seconds (ref 24–36)

## 2018-08-22 LAB — SARS CORONAVIRUS 2 (TAT 6-24 HRS): SARS Coronavirus 2: NEGATIVE

## 2018-08-22 SURGERY — REPAIR, TENDON, QUADRICEPS
Anesthesia: General | Site: Leg Upper | Laterality: Right

## 2018-08-22 MED ORDER — SUCCINYLCHOLINE CHLORIDE 20 MG/ML IJ SOLN
INTRAMUSCULAR | Status: DC | PRN
  Administered 2018-08-22: 180 mg via INTRAVENOUS

## 2018-08-22 MED ORDER — LACTATED RINGERS IV SOLN: INTRAVENOUS | Status: DC

## 2018-08-22 MED ORDER — ACETAMINOPHEN 10 MG/ML IV SOLN
1000.0000 mg | Freq: Once | INTRAVENOUS | Status: AC
  Administered 2018-08-22 (×2): 1000 mg via INTRAVENOUS

## 2018-08-22 MED ORDER — ONDANSETRON HCL 4 MG PO TABS: 4.0000 mg | ORAL_TABLET | Freq: Four times a day (QID) | ORAL | Status: DC | PRN

## 2018-08-22 MED ORDER — VANCOMYCIN HCL IN DEXTROSE 1-5 GM/200ML-% IV SOLN
1000.0000 mg | Freq: Two times a day (BID) | INTRAVENOUS | Status: AC
  Administered 2018-08-22: 22:00:00 1000 mg via INTRAVENOUS
  Filled 2018-08-22: qty 200

## 2018-08-22 MED ORDER — POTASSIUM CHLORIDE IN NACL 20-0.9 MEQ/L-% IV SOLN
INTRAVENOUS | Status: DC
  Administered 2018-08-22 – 2018-08-23 (×2): via INTRAVENOUS
  Filled 2018-08-22 (×4): qty 1000

## 2018-08-22 MED ORDER — OXYCODONE HCL 5 MG PO TABS: 10.0000 mg | ORAL_TABLET | ORAL | Status: DC | PRN

## 2018-08-22 MED ORDER — LIDOCAINE HCL (CARDIAC) PF 100 MG/5ML IV SOSY
PREFILLED_SYRINGE | INTRAVENOUS | Status: DC | PRN
  Administered 2018-08-22: 100 mg via INTRAVENOUS

## 2018-08-22 MED ORDER — CHLORHEXIDINE GLUCONATE CLOTH 2 % EX PADS
6.0000 | MEDICATED_PAD | Freq: Once | CUTANEOUS | Status: DC
  Administered 2018-08-22: 10:00:00 6 via TOPICAL

## 2018-08-22 MED ORDER — ROCURONIUM BROMIDE 50 MG/5ML IV SOLN
INTRAVENOUS | Status: AC
  Filled 2018-08-22: qty 1

## 2018-08-22 MED ORDER — HYDROMORPHONE HCL 1 MG/ML IJ SOLN: 0.5000 mg | INTRAMUSCULAR | Status: DC | PRN

## 2018-08-22 MED ORDER — TRAMADOL HCL 50 MG PO TABS
50.0000 mg | ORAL_TABLET | Freq: Four times a day (QID) | ORAL | Status: DC
  Administered 2018-08-22 – 2018-08-23 (×3): 50 mg via ORAL
  Filled 2018-08-22 (×4): qty 1

## 2018-08-22 MED ORDER — EPHEDRINE SULFATE 50 MG/ML IJ SOLN
INTRAMUSCULAR | Status: DC | PRN
  Administered 2018-08-22: 10 mg via INTRAVENOUS

## 2018-08-22 MED ORDER — MAGNESIUM CITRATE PO SOLN
1.0000 | Freq: Once | ORAL | Status: DC | PRN
  Filled 2018-08-22: qty 296

## 2018-08-22 MED ORDER — PROMETHAZINE HCL 25 MG/ML IJ SOLN: 6.2500 mg | INTRAMUSCULAR | Status: DC | PRN

## 2018-08-22 MED ORDER — PHENYLEPHRINE HCL (PRESSORS) 10 MG/ML IV SOLN
INTRAVENOUS | Status: DC | PRN
  Administered 2018-08-22: 100 ug via INTRAVENOUS
  Administered 2018-08-22: 200 ug via INTRAVENOUS

## 2018-08-22 MED ORDER — ALLOPURINOL 300 MG PO TABS
450.0000 mg | ORAL_TABLET | Freq: Every day | ORAL | Status: DC
  Administered 2018-08-23: 450 mg via ORAL
  Filled 2018-08-22: qty 1.5

## 2018-08-22 MED ORDER — METHOCARBAMOL 500 MG PO TABS: 500.0000 mg | ORAL_TABLET | Freq: Four times a day (QID) | ORAL | Status: DC | PRN

## 2018-08-22 MED ORDER — FERROUS SULFATE 325 (65 FE) MG PO TABS
325.0000 mg | ORAL_TABLET | Freq: Three times a day (TID) | ORAL | Status: DC
  Administered 2018-08-22 – 2018-08-23 (×2): 325 mg via ORAL
  Filled 2018-08-22 (×2): qty 1

## 2018-08-22 MED ORDER — BISACODYL 10 MG RE SUPP: 10.0000 mg | Freq: Every day | RECTAL | Status: DC | PRN

## 2018-08-22 MED ORDER — NEOMYCIN-POLYMYXIN B GU 40-200000 IR SOLN
Status: AC
  Filled 2018-08-22: qty 4

## 2018-08-22 MED ORDER — ROCURONIUM BROMIDE 100 MG/10ML IV SOLN
INTRAVENOUS | Status: DC | PRN
  Administered 2018-08-22: 40 mg via INTRAVENOUS
  Administered 2018-08-22: 10 mg via INTRAVENOUS

## 2018-08-22 MED ORDER — PROPOFOL 10 MG/ML IV BOLUS
INTRAVENOUS | Status: DC | PRN
  Administered 2018-08-22: 220 mg via INTRAVENOUS

## 2018-08-22 MED ORDER — METHOCARBAMOL 1000 MG/10ML IJ SOLN
500.0000 mg | Freq: Four times a day (QID) | INTRAVENOUS | Status: DC | PRN
  Filled 2018-08-22: qty 5

## 2018-08-22 MED ORDER — MIDAZOLAM HCL 2 MG/2ML IJ SOLN
INTRAMUSCULAR | Status: AC
  Filled 2018-08-22: qty 2

## 2018-08-22 MED ORDER — LIDOCAINE HCL (PF) 2 % IJ SOLN
INTRAMUSCULAR | Status: AC
  Filled 2018-08-22: qty 10

## 2018-08-22 MED ORDER — DEXAMETHASONE SODIUM PHOSPHATE 10 MG/ML IJ SOLN
INTRAMUSCULAR | Status: AC
  Filled 2018-08-22: qty 1

## 2018-08-22 MED ORDER — ACETAMINOPHEN 325 MG PO TABS: 325.0000 mg | ORAL_TABLET | Freq: Four times a day (QID) | ORAL | Status: DC | PRN

## 2018-08-22 MED ORDER — NEOMYCIN-POLYMYXIN B GU 40-200000 IR SOLN
Status: DC | PRN
  Administered 2018-08-22: 4 mL

## 2018-08-22 MED ORDER — MIDAZOLAM HCL 2 MG/2ML IJ SOLN
INTRAMUSCULAR | Status: DC | PRN
  Administered 2018-08-22: 2 mg via INTRAVENOUS

## 2018-08-22 MED ORDER — FENTANYL CITRATE (PF) 100 MCG/2ML IJ SOLN
INTRAMUSCULAR | Status: AC
  Filled 2018-08-22: qty 2

## 2018-08-22 MED ORDER — DOXYCYCLINE HYCLATE 100 MG PO TABS: 100.0000 mg | ORAL_TABLET | Freq: Two times a day (BID) | ORAL | Status: DC | PRN

## 2018-08-22 MED ORDER — OXYCODONE HCL 5 MG PO TABS: 5.0000 mg | ORAL_TABLET | ORAL | Status: DC | PRN

## 2018-08-22 MED ORDER — MENTHOL 3 MG MT LOZG
1.0000 | LOZENGE | OROMUCOSAL | Status: DC | PRN
  Filled 2018-08-22: qty 9

## 2018-08-22 MED ORDER — ACETAMINOPHEN 500 MG PO TABS
1000.0000 mg | ORAL_TABLET | Freq: Four times a day (QID) | ORAL | Status: AC
  Administered 2018-08-22 – 2018-08-23 (×4): 1000 mg via ORAL
  Filled 2018-08-22 (×4): qty 2

## 2018-08-22 MED ORDER — ONDANSETRON HCL 4 MG/2ML IJ SOLN
INTRAMUSCULAR | Status: AC
  Filled 2018-08-22: qty 2

## 2018-08-22 MED ORDER — FENTANYL CITRATE (PF) 100 MCG/2ML IJ SOLN
INTRAMUSCULAR | Status: DC | PRN
  Administered 2018-08-22 (×4): 50 ug via INTRAVENOUS

## 2018-08-22 MED ORDER — BUPIVACAINE HCL (PF) 0.25 % IJ SOLN
INTRAMUSCULAR | Status: AC
  Filled 2018-08-22: qty 30

## 2018-08-22 MED ORDER — HYDROMORPHONE HCL 1 MG/ML IJ SOLN
0.5000 mg | INTRAMUSCULAR | Status: DC | PRN
  Administered 2018-08-22 (×3): 0.5 mg via INTRAVENOUS

## 2018-08-22 MED ORDER — DOCUSATE SODIUM 100 MG PO CAPS
100.0000 mg | ORAL_CAPSULE | Freq: Two times a day (BID) | ORAL | Status: DC
  Administered 2018-08-22 – 2018-08-23 (×2): 100 mg via ORAL
  Filled 2018-08-22 (×2): qty 1

## 2018-08-22 MED ORDER — PROPOFOL 10 MG/ML IV BOLUS
INTRAVENOUS | Status: AC
  Filled 2018-08-22: qty 20

## 2018-08-22 MED ORDER — GEMFIBROZIL 600 MG PO TABS
600.0000 mg | ORAL_TABLET | Freq: Two times a day (BID) | ORAL | Status: DC
  Administered 2018-08-22 – 2018-08-23 (×2): 600 mg via ORAL
  Filled 2018-08-22 (×3): qty 1

## 2018-08-22 MED ORDER — ONDANSETRON HCL 4 MG/2ML IJ SOLN: 4.0000 mg | Freq: Four times a day (QID) | INTRAMUSCULAR | Status: DC | PRN

## 2018-08-22 MED ORDER — MEPERIDINE HCL 50 MG/ML IJ SOLN: 6.2500 mg | INTRAMUSCULAR | Status: DC | PRN

## 2018-08-22 MED ORDER — OXYCODONE HCL 5 MG PO TABS: 5.0000 mg | ORAL_TABLET | Freq: Once | ORAL | Status: DC | PRN

## 2018-08-22 MED ORDER — BUPIVACAINE HCL (PF) 0.25 % IJ SOLN
INTRAMUSCULAR | Status: DC | PRN
  Administered 2018-08-22: 30 mL

## 2018-08-22 MED ORDER — FENTANYL CITRATE (PF) 100 MCG/2ML IJ SOLN
INTRAMUSCULAR | Status: AC
  Administered 2018-08-22: 50 ug via INTRAVENOUS
  Filled 2018-08-22: qty 2

## 2018-08-22 MED ORDER — PHENOL 1.4 % MT LIQD
1.0000 | OROMUCOSAL | Status: DC | PRN
  Filled 2018-08-22: qty 177

## 2018-08-22 MED ORDER — FENTANYL CITRATE (PF) 100 MCG/2ML IJ SOLN
25.0000 ug | INTRAMUSCULAR | Status: DC | PRN
  Administered 2018-08-22 (×2): 50 ug via INTRAVENOUS

## 2018-08-22 MED ORDER — SUCCINYLCHOLINE CHLORIDE 20 MG/ML IJ SOLN
INTRAMUSCULAR | Status: AC
  Filled 2018-08-22: qty 1

## 2018-08-22 MED ORDER — OXYCODONE HCL 5 MG/5ML PO SOLN: 5.0000 mg | Freq: Once | ORAL | Status: DC | PRN

## 2018-08-22 MED ORDER — ENOXAPARIN SODIUM 40 MG/0.4ML ~~LOC~~ SOLN
40.0000 mg | SUBCUTANEOUS | Status: DC
  Administered 2018-08-23: 40 mg via SUBCUTANEOUS
  Filled 2018-08-22: qty 0.4

## 2018-08-22 MED ORDER — ACETAMINOPHEN 10 MG/ML IV SOLN
INTRAVENOUS | Status: AC
  Administered 2018-08-22: 18:00:00 1000 mg via INTRAVENOUS
  Filled 2018-08-22: qty 100

## 2018-08-22 MED ORDER — ALUM & MAG HYDROXIDE-SIMETH 200-200-20 MG/5ML PO SUSP: 30.0000 mL | ORAL | Status: DC | PRN

## 2018-08-22 MED ORDER — HYDROCHLOROTHIAZIDE 25 MG PO TABS
12.5000 mg | ORAL_TABLET | Freq: Every day | ORAL | Status: DC
  Administered 2018-08-23: 12.5 mg via ORAL
  Filled 2018-08-22: qty 1

## 2018-08-22 MED ORDER — POLYETHYLENE GLYCOL 3350 17 G PO PACK
17.0000 g | PACK | Freq: Every day | ORAL | Status: DC | PRN
  Administered 2018-08-22: 17 g via ORAL
  Filled 2018-08-22: qty 1

## 2018-08-22 MED ORDER — HYDROMORPHONE HCL 1 MG/ML IJ SOLN
INTRAMUSCULAR | Status: AC
  Administered 2018-08-22: 0.5 mg via INTRAVENOUS
  Filled 2018-08-22: qty 1

## 2018-08-22 MED ORDER — DEXAMETHASONE SODIUM PHOSPHATE 10 MG/ML IJ SOLN
INTRAMUSCULAR | Status: DC | PRN
  Administered 2018-08-22: 10 mg via INTRAVENOUS

## 2018-08-22 SURGICAL SUPPLY — 56 items
BANDAGE ELASTIC 6 LF NS (GAUZE/BANDAGES/DRESSINGS) ×3 IMPLANT
BLADE CLIPPER SURG (BLADE) ×3 IMPLANT
BLADE SURG SZ10 CARB STEEL (BLADE) ×6 IMPLANT
BNDG COHESIVE 4X5 TAN STRL (GAUZE/BANDAGES/DRESSINGS) ×3 IMPLANT
BNDG COHESIVE 6X5 TAN STRL LF (GAUZE/BANDAGES/DRESSINGS) ×3 IMPLANT
BNDG ESMARK 4X12 TAN STRL LF (GAUZE/BANDAGES/DRESSINGS) ×3 IMPLANT
BUR 4X55 1 (BURR) ×3 IMPLANT
BUR SURG 4X8 MED (BURR) ×1 IMPLANT
BURR SURG 4MMX8MM MEDIUM (BURR) ×1
BURR SURG 4X8 MED (BURR) ×2
CANISTER SUCT 1200ML W/VALVE (MISCELLANEOUS) ×3 IMPLANT
COOLER POLAR GLACIER W/PUMP (MISCELLANEOUS) ×3 IMPLANT
COVER WAND RF STERILE (DRAPES) ×3 IMPLANT
CUFF TOURN SGL QUICK 34 (TOURNIQUET CUFF) ×2
CUFF TRNQT CYL 34X4.125X (TOURNIQUET CUFF) ×1 IMPLANT
DRAPE U-SHAPE 47X51 STRL (DRAPES) ×3 IMPLANT
DRSG OPSITE POSTOP 4X8 (GAUZE/BANDAGES/DRESSINGS) ×3 IMPLANT
DURAPREP 26ML APPLICATOR (WOUND CARE) ×9 IMPLANT
ELECT REM PT RETURN 9FT ADLT (ELECTROSURGICAL) ×3
ELECTRODE REM PT RTRN 9FT ADLT (ELECTROSURGICAL) ×1 IMPLANT
GAUZE SPONGE 4X4 12PLY STRL (GAUZE/BANDAGES/DRESSINGS) ×3 IMPLANT
GAUZE XEROFORM 1X8 LF (GAUZE/BANDAGES/DRESSINGS) IMPLANT
GLOVE BIOGEL PI IND STRL 7.5 (GLOVE) ×4 IMPLANT
GLOVE BIOGEL PI IND STRL 9 (GLOVE) ×1 IMPLANT
GLOVE BIOGEL PI INDICATOR 7.5 (GLOVE) ×8
GLOVE BIOGEL PI INDICATOR 9 (GLOVE) ×2
GLOVE SURG 9.0 ORTHO LTXF (GLOVE) ×6 IMPLANT
GOWN STRL REUS TWL 2XL XL LVL4 (GOWN DISPOSABLE) ×3 IMPLANT
GOWN STRL REUS W/ TWL LRG LVL3 (GOWN DISPOSABLE) ×3 IMPLANT
GOWN STRL REUS W/TWL LRG LVL3 (GOWN DISPOSABLE) ×6
HANDLE YANKAUER SUCT BULB TIP (MISCELLANEOUS) ×3 IMPLANT
KIT TURNOVER KIT A (KITS) ×3 IMPLANT
PACK EXTREMITY ARMC (MISCELLANEOUS) ×3 IMPLANT
PAD ABD DERMACEA PRESS 5X9 (GAUZE/BANDAGES/DRESSINGS) ×6 IMPLANT
PAD CAST CTTN 4X4 STRL (SOFTGOODS) ×2 IMPLANT
PAD WRAPON POLOR MULTI XL (MISCELLANEOUS) ×1 IMPLANT
PADDING CAST COTTON 4X4 STRL (SOFTGOODS) ×4
RETRIEVER SUT HEWSON (MISCELLANEOUS) ×3 IMPLANT
SPONGE LAP 18X18 RF (DISPOSABLE) ×3 IMPLANT
STAPLER SKIN PROX 35W (STAPLE) ×3 IMPLANT
STOCKINETTE M/LG 89821 (MISCELLANEOUS) ×3 IMPLANT
SUT ETHIBOND 5-0 MS/4 CCS GRN (SUTURE)
SUT FIBERWIRE #5 38 CONV BLUE (SUTURE) ×6
SUT ORTHOCORD OS-6 NDL 36 (SUTURE) IMPLANT
SUT ORTHOCORD W/MULTIPK NDL (SUTURE) IMPLANT
SUT TICRON 2-0 30IN 311381 (SUTURE) ×9 IMPLANT
SUT VIC AB 0 CT1 36 (SUTURE) ×3 IMPLANT
SUT VIC AB 2-0 CT1 (SUTURE) ×3 IMPLANT
SUT VICRYL 1-0 27IN AB (SUTURE)
SUT VICRYL 1-0 27IN ABS (SUTURE)
SUTURE ETHBND 5-0 MS/4 CCS GRN (SUTURE) IMPLANT
SUTURE FIBERWR #5 38 CONV BLUE (SUTURE) ×2 IMPLANT
SUTURE VICRYL 1-0 27IN AB (SUTURE) IMPLANT
SYR 20ML LL LF (SYRINGE) ×3 IMPLANT
WRAP-ON POLOR PAD MULTI XL (MISCELLANEOUS) ×1
WRAPON POLOR PAD MULTI XL (MISCELLANEOUS) ×2

## 2018-08-22 NOTE — H&P (Signed)
PREOPERATIVE H&P  Chief Complaint: Right quadriceps tendon tear  HPI: Kevin Petty is a 63 y.o. male who presents for preoperative history and physical with a diagnosis of right quadriceps tendon tear. Symptoms of pain, swelling and weakness and inability to extend his knee are significantly impairing activities of daily living.  MRI is confirmed a full-thickness tear of the right quadriceps tendon.   The patient has agreed with surgical repair.   Past Medical History:  Diagnosis Date  . Hypertension    History reviewed. No pertinent surgical history. Social History   Socioeconomic History  . Marital status: Married    Spouse name: Not on file  . Number of children: Not on file  . Years of education: Not on file  . Highest education level: Not on file  Occupational History  . Not on file  Social Needs  . Financial resource strain: Not on file  . Food insecurity    Worry: Not on file    Inability: Not on file  . Transportation needs    Medical: Not on file    Non-medical: Not on file  Tobacco Use  . Smoking status: Never Smoker  . Smokeless tobacco: Never Used  Substance and Sexual Activity  . Alcohol use: Not on file  . Drug use: Not on file  . Sexual activity: Not on file  Lifestyle  . Physical activity    Days per week: Not on file    Minutes per session: Not on file  . Stress: Not on file  Relationships  . Social Herbalist on phone: Not on file    Gets together: Not on file    Attends religious service: Not on file    Active member of club or organization: Not on file    Attends meetings of clubs or organizations: Not on file    Relationship status: Not on file  Other Topics Concern  . Not on file  Social History Narrative  . Not on file   History reviewed. No pertinent family history. Allergies  Allergen Reactions  . Penicillins Rash    Did it involve swelling of the face/tongue/throat, SOB, or low BP? No Did it involve sudden or severe  rash/hives, skin peeling, or any reaction on the inside of your mouth or nose? No Did you need to seek medical attention at a hospital or doctor's office? Unknown When did it last happen?childhood allergy If all above answers are "NO", may proceed with cephalosporin use.    Prior to Admission medications   Medication Sig Start Date End Date Taking? Authorizing Provider  allopurinol (ZYLOPRIM) 300 MG tablet Take 450 mg by mouth daily.   Yes [provider]  doxycycline (VIBRAMYCIN) 100 MG capsule Take 100 mg by mouth 2 (two) times daily as needed (rosacea flare).   Yes [provider]  gemfibrozil (LOPID) 600 MG tablet Take 600 mg by mouth 2 (two) times daily before a meal.   Yes [provider]  hydrochlorothiazide (HYDRODIURIL) 25 MG tablet Take 12.5 mg by mouth daily.   Yes [provider]  traMADol (ULTRAM) 50 MG tablet Take 1 tablet (50 mg total) by mouth every 6 (six) hours as needed. Patient not taking: Reported on 08/21/2018 08/13/18   Versie Starks, PA-C     Positive ROS: All other systems have been reviewed and were otherwise negative with the exception of those mentioned in the HPI and as above.  Physical Exam: General: Alert, no acute  distress Cardiovascular: Regular rate and rhythm, no murmurs rubs or gallops.  No pedal edema Respiratory: Clear to auscultation bilaterally, no wheezes rales or rhonchi. No cyanosis, no use of accessory musculature GI: No organomegaly, abdomen is soft and non-tender nondistended with positive bowel sounds. Skin: Skin intact, no lesions within the operative field. Neurologic: Sensation intact distally Psychiatric: Patient is competent for consent with normal mood and affect Lymphatic: No cervical lymphadenopathy  MUSCULOSKELETAL: Right knee: Patient skin is intact.  He has a moderate sized hemarthrosis which is improving over the past week.  Patient has resolving ecchymosis but no erythema.  He has no calf  tenderness.  His leg and thigh compartments are soft and compressible.  He has palpable pedal pulses, intact sensation light touch and intact motor function distally.  Patient is unable to actively extend his knee and has pain to resisted knee extension.  Assessment: S76.191A other specified injury of right quadriceps muscle, fascia and tendon  Plan: Plan for Procedure(s): RIGHT QUADRICEPS TENDON REPAIR  I described the details of the operation as well as the postoperative course with the patient.  I also discussed the risks and benefits of surgery. The risks include but are not limited to infection, bleeding requiring blood transfusion, nerve or blood vessel injury, joint stiffness or loss of motion, persistent pain, weakness or instability, re-tear of the quadriceps tendon, failure of the repair and hardware failure and the need for further surgery. Medical risks include but are not limited to DVT and pulmonary embolism, myocardial infarction, stroke, pneumonia, respiratory failure and death. Patient understood these risks and wished to proceed.     Juanell FairlyKevin Aysel Gilchrest, MD   08/22/2018 12:11 PM

## 2018-08-22 NOTE — Op Note (Signed)
08/22/2018  6:20 PM  PATIENT:  Kevin Petty DIAGNOSIS: Right quadriceps tendon rupture  POST-OPERATIVE DIAGNOSIS:  Same  PROCEDURE:  OPEN REPAIR OF RIGHT QUADRICEPS TENDON  SURGEON:  Thornton Park, MD  ANESTHESIA:   General  PREOPERATIVE INDICATIONS:  NASIF BOS is a  63 y.o. male with a diagnosis of right quadriceps tendon rupture, confirmed by MRI, who wished to proceed with surgical repair given his inability to actively extend his right knee.  This injury occurred as the result of slipping down stairs..    I discussed the risks and benefits of surgery. The risks include but are not limited to infection, bleeding requiring blood transfusion, nerve or blood vessel injury, joint stiffness or loss of motion, persistent pain, weakness or instability, malunion, nonunion and hardware failure and the need for further surgery. Medical risks include but are not limited to DVT and pulmonary embolism, myocardial infarction, stroke, pneumonia, respiratory failure and death. Patient understood these risks and wished to proceed.   OPERATIVE FINDINGS: Complete rupture of right quadriceps tendon from the superior pole of the patella.  Patient with a displaced bipartite patella fragment.  OPERATIVE PROCEDURE:  Patient was met in the preoperative area.  A preop H&P was performed at the bedside.  I marked the right knee with the word yes and my initials according the hospital's correct site of surgery protocol.  Patient was brought to the operating room was placed supine on the operative table.  A bump was placed under his right hip.  A tourniquet was applied to his right thigh.  He was prepped and draped in sterile fashion.  Timeout was performed to verify the patient's name, date of birth, medical record number, correct site of surgery and correct procedure to be performed.  Was also used to confirm the patient received antibiotics and all appropriate instruments, implants  and radiographic studies were available in the room.  Once all in attendance were in agreement case began.  Patient had his right lower extremity exsanguinated with an Esmarch and the tourniquet inflated to 275 mmHg for 83 minutes.  A longitudinal incision was made over the right knee extending a few centimeters above and below the patella.  Full-thickness skin flaps were developed.  The quadriceps tendon rupture was then identified.  A displaced bipartite patella fragment was identified and removed.  The knee joint was copiously irrigated.  The frayed edges of the tendon were carefully debrided to remove all nonviable tissue.  The superior pole of the patella was debrided with a rongeur and a ball tipped bur was used to debride the superior pole of the patella until punctate bleeding was identified.  Two #5 FiberWire sutures were then used to secure the quadriceps tendon using a Krakw suture technique.  The 4 limbs of these 2 sutures were then clamped with a hemostat for later passage through the patella.  A 2.5 mm drill bit was then used to drill 3 vertical tunnels through the patella.  A Hewson suture passer was used to shuttle the two central limbs of the #5 FiberWire through the center hole.  A single suture was placed through the medial and lateral holes.  The sutures were then tightened to allow for anatomic approximation of the quadriceps tendon to the superior pole of the patella.  The sutures were hand tied into place.  The quadriceps tendon repair was oversewed with #2 Tycron in an interrupted fashion.  This #2 Basilio Cairo was also used to repair  the medial and lateral retinaculum were also torn.  The wound was then copiously irrigated.  The subcutaneous tissue was closed in 2 layers with 0 and 2-0 Vicryl.  The skin was approximated with staples.  Dry sterile dressing was applied.  The tourniquet was deflated.    Patient had a Polar Care sleeve placed over the right knee in a hinged knee brace  locked in extension was applied to the right knee as well.  The patient was awoken and brought to the PACU in stable condition.  I was scrubbed and present for the entire case.  All sharp, sponge and instrument counts were correct at the conclusion the case.  I spoke with the patient's wife in the postoperative consultation room to let her know the surgery was performed without complication and the patient was stable in the recovery room.

## 2018-08-22 NOTE — Anesthesia Post-op Follow-up Note (Signed)
Anesthesia QCDR form completed.        

## 2018-08-22 NOTE — Anesthesia Postprocedure Evaluation (Signed)
Anesthesia Post Note  Patient: Kevin Petty  Procedure(s) Performed: REPAIR QUADRICEP TENDON (Right Leg Upper)  Patient location during evaluation: PACU Anesthesia Type: General Level of consciousness: awake and alert Pain management: pain level controlled Vital Signs Assessment: post-procedure vital signs reviewed and stable Respiratory status: spontaneous breathing, nonlabored ventilation, respiratory function stable and patient connected to nasal cannula oxygen Cardiovascular status: blood pressure returned to baseline and stable Postop Assessment: no apparent nausea or vomiting Anesthetic complications: no     Last Vitals:  Vitals:   08/22/18 1606 08/22/18 1705  BP: 122/80 130/79  Pulse: 66 71  Resp: 15 17  Temp:  37.2 C  SpO2: 99% 100%    Last Pain:  Vitals:   08/22/18 1603  TempSrc:   PainSc: [place]

## 2018-08-22 NOTE — Anesthesia Procedure Notes (Signed)
Procedure Name: Intubation Date/Time: 08/22/2018 12:25 PM Performed by: Philbert Riser, CRNA Pre-anesthesia Checklist: Patient identified, Emergency Drugs available, Suction available, Patient being monitored and Timeout performed Patient Re-evaluated:Patient Re-evaluated prior to induction Oxygen Delivery Method: Circle system utilized and Simple face mask Preoxygenation: Pre-oxygenation with 100% oxygen Induction Type: IV induction Ventilation: Mask ventilation without difficulty Laryngoscope Size: McGraph and 4 Grade View: Grade I Tube type: Oral Tube size: 7.5 mm Number of attempts: 1 Airway Equipment and Method: Stylet Placement Confirmation: ETT inserted through vocal cords under direct vision,  positive ETCO2 and breath sounds checked- equal and bilateral Secured at: 26 cm Tube secured with: Tape Dental Injury: Teeth and Oropharynx as per pre-operative assessment

## 2018-08-22 NOTE — Anesthesia Procedure Notes (Signed)
Date/Time: 08/22/2018 12:26 PM Performed by: Philbert Riser, CRNA Pre-anesthesia Checklist: Patient identified, Emergency Drugs available, Suction available, Patient being monitored and Timeout performed Patient Re-evaluated:Patient Re-evaluated prior to induction Laryngoscope Size: Mac, McGraph and 3 Grade View: Grade IV Tube size: 7.5 mm Number of attempts: 1 Airway Equipment and Method: Video-laryngoscopy Placement Confirmation: ETT inserted through vocal cords under direct vision Secured at: 27 cm Tube secured with: Tape Difficulty Due To: Difficulty was anticipated, Difficult Airway- due to large tongue, Difficult Airway- due to reduced neck mobility and Difficult Airway- due to anterior larynx Future Recommendations: Recommend- induction with short-acting agent, and alternative techniques readily available

## 2018-08-22 NOTE — Transfer of Care (Signed)
Immediate Anesthesia Transfer of Care Note  Patient: Kevin Petty  Procedure(s) Performed: REPAIR QUADRICEP TENDON (Right Leg Upper)  Patient Location: PACU  Anesthesia Type:General  Level of Consciousness: awake  Airway & Oxygen Therapy: Patient Spontanous Breathing and Patient connected to face mask oxygen  Post-op Assessment: Report given to RN and Post -op Vital signs reviewed and stable  Post vital signs: Reviewed and stable  Last Vitals:  Vitals Value Taken Time  BP 136/84 08/22/18 1435  Temp    Pulse 74 08/22/18 1436  Resp 20 08/22/18 1436  SpO2 100 % 08/22/18 1436  Vitals shown include unvalidated device data.  Last Pain:  Vitals:   08/22/18 0920  TempSrc: Temporal  PainSc: 0-No pain         Complications: No apparent anesthesia complications

## 2018-08-22 NOTE — Anesthesia Preprocedure Evaluation (Signed)
Anesthesia Evaluation  Patient identified by MRN, date of birth, ID band Patient awake    Reviewed: Allergy & Precautions, NPO status , Patient's Chart, lab work & pertinent test results  History of Anesthesia Complications Negative for: history of anesthetic complications  Airway Mallampati: II  TM Distance: >3 FB Neck ROM: Full    Dental  (+) Poor Dentition   Pulmonary neg sleep apnea, neg COPD,    breath sounds clear to auscultation- rhonchi (-) wheezing      Cardiovascular hypertension, Pt. on medications (-) CAD, (-) Past MI, (-) Cardiac Stents and (-) CABG  Rhythm:Regular Rate:Normal - Systolic murmurs and - Diastolic murmurs    Neuro/Psych neg Seizures negative neurological ROS  negative psych ROS   GI/Hepatic negative GI ROS, Neg liver ROS,   Endo/Other  negative endocrine ROSneg diabetes  Renal/GU negative Renal ROS     Musculoskeletal negative musculoskeletal ROS (+)   Abdominal (+) + obese,   Peds  Hematology negative hematology ROS (+)   Anesthesia Other Findings Past Medical History: No date: Hypertension   Reproductive/Obstetrics                             Anesthesia Physical Anesthesia Plan  ASA: II  Anesthesia Plan: General   Post-op Pain Management:    Induction: Intravenous  PONV Risk Score and Plan: 1 and Ondansetron and Midazolam  Airway Management Planned: Oral ETT  Additional Equipment:   Intra-op Plan:   Post-operative Plan: Extubation in OR  Informed Consent: I have reviewed the patients History and Physical, chart, labs and discussed the procedure including the risks, benefits and alternatives for the proposed anesthesia with the patient or authorized representative who has indicated his/her understanding and acceptance.     Dental advisory given  Plan Discussed with: CRNA and Anesthesiologist  Anesthesia Plan Comments:          Anesthesia Quick Evaluation

## 2018-08-23 DIAGNOSIS — S76111A Strain of right quadriceps muscle, fascia and tendon, initial encounter: Secondary | ICD-10-CM | POA: Diagnosis not present

## 2018-08-23 LAB — CBC
HCT: 39.1 % (ref 39.0–52.0)
Hemoglobin: 13.4 g/dL (ref 13.0–17.0)
MCH: 29.4 pg (ref 26.0–34.0)
MCHC: 34.3 g/dL (ref 30.0–36.0)
MCV: 85.7 fL (ref 80.0–100.0)
Platelets: 330 10*3/uL (ref 150–400)
RBC: 4.56 MIL/uL (ref 4.22–5.81)
RDW: 12 % (ref 11.5–15.5)
WBC: 10.7 10*3/uL — ABNORMAL HIGH (ref 4.0–10.5)
nRBC: 0 % (ref 0.0–0.2)

## 2018-08-23 LAB — BASIC METABOLIC PANEL
Anion gap: 7 (ref 5–15)
BUN: 18 mg/dL (ref 8–23)
CO2: 26 mmol/L (ref 22–32)
Calcium: 8.8 mg/dL — ABNORMAL LOW (ref 8.9–10.3)
Chloride: 100 mmol/L (ref 98–111)
Creatinine, Ser: 0.87 mg/dL (ref 0.61–1.24)
GFR calc Af Amer: >60 mL/min (ref >60)
GFR calc non Af Amer: >60 mL/min (ref >60)
Glucose, Bld: 198 mg/dL — ABNORMAL HIGH (ref 70–99)
Potassium: 3.7 mmol/L (ref 3.5–5.1)
Sodium: 133 mmol/L — ABNORMAL LOW (ref 135–145)

## 2018-08-23 MED ORDER — OXYCODONE HCL 5 MG PO TABS: 5.0000 mg | ORAL_TABLET | ORAL | 0 refills | Status: AC | PRN

## 2018-08-23 MED ORDER — METHOCARBAMOL 500 MG PO TABS: 500.0000 mg | ORAL_TABLET | Freq: Four times a day (QID) | ORAL | 0 refills | Status: AC | PRN

## 2018-08-23 MED ORDER — ACETAMINOPHEN 325 MG PO TABS: 325.0000 mg | ORAL_TABLET | Freq: Four times a day (QID) | ORAL | 1 refills | Status: AC | PRN

## 2018-08-23 MED ORDER — DOCUSATE SODIUM 100 MG PO CAPS: 100.0000 mg | ORAL_CAPSULE | Freq: Two times a day (BID) | ORAL | 0 refills | Status: AC

## 2018-08-23 MED ORDER — ENOXAPARIN SODIUM 40 MG/0.4ML ~~LOC~~ SOLN: 40.0000 mg | SUBCUTANEOUS | 0 refills | Status: AC

## 2018-08-23 NOTE — Discharge Summary (Signed)
Physician Discharge Summary  Patient ID: STEEN BISIG MRN: [id number] DOB/AGE: 1955/02/29 63 y.o.  Admit date: 08/22/2018 Discharge date: 08/23/2018  Admission Diagnoses:  Right quadriceps tendon rupture status post repair  Discharge Diagnoses:  Right quadriceps tendon rupture status post repair Active Problems:   Quadriceps muscle rupture, right, initial encounter   Past Medical History:  Diagnosis Date  . Hypertension     Surgeries: Procedure(s): REPAIR QUADRICEP TENDON on 08/22/2018   Consultants (if any):   Discharged Condition: Improved  Hospital Course: VOLNEY REIERSON is an 63 y.o. male who was admitted 08/22/2018 with a diagnosis of right quadriceps tendon rupture and went to the operating room on 08/22/2018 and underwent an uncomplicated open right quadriceps tendon repair.    He was given perioperative antibiotics:  Anti-infectives (From admission, onward)   Start     Dose/Rate Route Frequency Ordered Stop   08/22/18 2200  vancomycin (VANCOCIN) IVPB 1000 mg/200 mL premix     1,000 mg 200 mL/hr over 60 Minutes Intravenous Every 12 hours 08/22/18 1645 08/22/18 2244   08/22/18 1644  doxycycline (VIBRA-TABS) tablet 100 mg     100 mg Oral 2 times daily PRN 08/22/18 1644     08/22/18 0600  vancomycin (VANCOCIN) 1,500 mg in sodium chloride 0.9 % 500 mL IVPB     1,500 mg 250 mL/hr over 120 Minutes Intravenous On call to O.R. 08/21/18 2139 08/22/18 1210    .  He was given sequential compression devices, early ambulation, and Lovenox for DVT prophylaxis.  Patient gives a history of previous DVT.  Patient was admitted to the orthopedic floor postoperatively for pain management.  His pain is greatly improved.  He has been evaluated by physical and Occupational Therapy.  He is ready for discharge home.  He benefited maximally from the hospital stay and there were no complications.    Recent vital signs:  Vitals:   08/23/18 0332 08/23/18 0832  BP: 140/84 130/86   Pulse: 74 77  Resp: 15 20  Temp: 98.2 F (36.8 C) 98.2 F (36.8 C)  SpO2: 98% 99%    Recent laboratory studies:  Lab Results  Component Value Date   HGB 13.4 08/23/2018   HGB 13.3 08/22/2018   HGB 12.7 (L) 09/14/2013   Lab Results  Component Value Date   WBC 10.7 (H) 08/23/2018   PLT 330 08/23/2018   Lab Results  Component Value Date   INR 1.2 08/22/2018   Lab Results  Component Value Date   NA 133 (L) 08/23/2018   K 3.7 08/23/2018   CL 100 08/23/2018   CO2 26 08/23/2018   BUN 18 08/23/2018   CREATININE 0.87 08/23/2018   GLUCOSE 198 (H) 08/23/2018    Discharge Medications:   Allergies as of 08/23/2018      Reactions   Penicillins Rash   Did it involve swelling of the face/tongue/throat, SOB, or low BP? No Did it involve sudden or severe rash/hives, skin peeling, or any reaction on the inside of your mouth or nose? No Did you need to seek medical attention at a hospital or doctor's office? Unknown When did it last happen?childhood allergy If all above answers are "NO", may proceed with cephalosporin use.      Medication List    STOP taking these medications   gemfibrozil 600 MG tablet Commonly known as: LOPID   traMADol 50 MG tablet Commonly known as: ULTRAM     TAKE these medications   acetaminophen 325 MG tablet Commonly  known as: TYLENOL Take 1-2 tablets (325-650 mg total) by mouth every 6 (six) hours as needed for mild pain (pain score 1-3 or temp > 100.5).   allopurinol 300 MG tablet Commonly known as: ZYLOPRIM Take 450 mg by mouth daily.   docusate sodium 100 MG capsule Commonly known as: COLACE Take 1 capsule (100 mg total) by mouth 2 (two) times daily.   doxycycline 100 MG capsule Commonly known as: VIBRAMYCIN Take 100 mg by mouth 2 (two) times daily as needed (rosacea flare).   enoxaparin 40 MG/0.4ML injection Commonly known as: LOVENOX Inject 0.4 mLs (40 mg total) into the skin daily for 30 doses. Start taking on: August 24, 2018   hydrochlorothiazide 25 MG tablet Commonly known as: HYDRODIURIL Take 12.5 mg by mouth daily.   methocarbamol 500 MG tablet Commonly known as: ROBAXIN Take 1 tablet (500 mg total) by mouth every 6 (six) hours as needed for muscle spasms.   oxyCODONE 5 MG immediate release tablet Commonly known as: Oxy IR/ROXICODONE Take 1-2 tablets (5-10 mg total) by mouth every 4 (four) hours as needed for moderate pain (pain score 4-6).       Diagnostic Studies: Mr Knee Right Wo Contrast  Addendum Date: 08/16/2018   ADDENDUM REPORT: 08/16/2018 09:15 ADDENDUM: The patient was returned for additional imaging on 08/16/2018 without additional charge to the patient. This additional imaging includes most of the right thigh. This additional imaging confirms distal quadriceps tendinosis with a small full-thickness, nonretracted insertional tear involving the superficial and deep layers of the central tendon. Portions of the middle layer arising from the vastus medialis and vastus lateralis remain intact. There are enthesophytes in the distal tendon with a chronic avulsion fracture versus displaced secondary ossification center of a bipartite patella. No acute osseous findings are seen. The patient is status post right total hip arthroplasty. There is mild fatty atrophy within the proximal rectus femoris muscle. Patient has an apparent large loculated right scrotal hydrocele. Electronically Signed   By: Carey BullocksWilliam  Veazey M.D.   On: 08/16/2018 09:15   Result Date: 08/16/2018 CLINICAL DATA:  Right knee pain with limited extension after falling down stairs today. History of patellar fracture. Evaluate for quadriceps tendon rupture. EXAM: MRI OF THE RIGHT KNEE WITHOUT CONTRAST TECHNIQUE: Multiplanar, multisequence MR imaging of the knee was performed. No intravenous contrast was administered. After reviewing the initial images and noting the indication for the study, I attempted have the patient return for additional  imaging of the distal thigh. However, the patient had already been discharged from the emergency department. COMPARISON:  Radiographs 08/13/2018. FINDINGS: MENISCI Medial meniscus: The posterior horn and body are diminutive with free edge irregularity, suspicious for a degenerative tear in the absence of previous meniscal surgery. The meniscal root itself is intact, and no displaced meniscal fragments are identified. Lateral meniscus: Discoid configuration without evidence of acute tear. LIGAMENTS Cruciates:  Intact. Collaterals:  Intact. CARTILAGE Patellofemoral: Advanced patellofemoral degenerative changes with diffuse chondral thinning, osteophytes and subchondral cyst formation. Medial: Moderately advanced osteoarthritis with diffuse chondral thinning and osteophytes. Lateral: Milder lateral compartment osteoarthritis with mild chondral thinning and peripheral osteophytes. MISCELLANEOUS Joint:  Small joint effusion. Popliteal Fossa:  Small Baker's cyst. Extensor Mechanism: The patellar tendon appears normal. There is diffuse T2 hyperintensity within and surrounding the visualized quadriceps tendon. There is a large well corticated osseous fragment along superolateral aspect of the patella which may reflect a displaced bipartite patella or chronic avulsion fracture. The quadriceps tendon attached to this fragment  and is diffusely lax, suggesting a tear more proximally. I attempted to have the patient return for more proximal imaging the distal thigh, but the patient had already been discharged from the emergency department. Bones: No evidence of acute fracture or dislocation. As above, advanced tricompartmental degenerative changes with chronic fragmentation of the superior pole of the patella. Other: There is diffuse soft tissue edema anteriorly within and surrounding the distal quadriceps tendon and vastus medialis muscle. IMPRESSION: 1. Although the quadriceps tendon is incompletely visualized by this  examination, and attempts to have the patient return for additional imaging were unsuccessful due to patient discharge, there is suprapatellar soft tissue swelling and laxity of the quadriceps tendon implying a more proximal tear. The tendon is diffusely degenerated and inserts on a chronic avulsion fracture of the superior pole of the patella versus displaced secondary ossification center. 2. No evidence of acute fracture. 3. Degenerated medial meniscus with free edge tearing of the posterior horn and body. 4. Intact lateral meniscus, cruciate and collateral ligaments. Electronically Signed: By: Carey BullocksWilliam  Veazey M.D. On: 08/13/2018 16:11   Dg Knee Complete 4 Views Right  Result Date: 08/13/2018 CLINICAL DATA:  Knee pain and swelling post fall. EXAM: RIGHT KNEE - COMPLETE 4+ VIEW COMPARISON:  None. FINDINGS: No evidence of fracture, or dislocation or sizable joint effusion. Prepatellar/suprapatellar soft tissue swelling. 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 IMPRESSION: 1. No acute fracture or dislocation identified about the right Knee. 2. Prepatellar/suprapatellar soft tissue swelling. 3. Three compartment moderate to advanced arthritic changes. 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 Electronically Signed   By: Ted Mcalpineobrinka  Dimitrova M.D.   On: 08/13/2018 13:53   Dg Knee Right Port  Result Date: 08/22/2018 CLINICAL DATA:  Quadriceps tendon repair EXAM: PORTABLE RIGHT KNEE - 1-2 VIEW COMPARISON:  08/13/2018 FINDINGS: Interval postoperative changes from quadriceps tendon repair. Interval removal of large ossific fragment superior to the patella. Alignment is anatomic. 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 Expected postoperative changes within the anterior soft tissues. IMPRESSION: Interval postoperative changes from quadriceps tendon repair.  No radiographic evidence of postoperative complication. Electronically Signed   By: Duanne GuessNicholas  Plundo M.D.   On: 08/22/2018 15:59    Disposition: Discharge disposition: 01-Home or Self Care       Discharge Instructions    Call MD / Call 911   Complete by: As directed    If you experience chest pain or shortness of breath, CALL 911 and be transported to the hospital emergency room.  If you develope a fever above 101 F, pus (white drainage) or increased drainage or redness at the wound, or calf pain, call your surgeon's office.   Constipation Prevention   Complete by: As directed    Drink plenty of fluids.  Prune juice may be helpful.  You may use a stool softener, such as Colace (over the counter) 100 mg twice a day.  Use MiraLax (over the counter) for constipation as needed.   Diet general   Complete by: As directed    Discharge instructions   Complete by: As directed    Patient will continue 25% partial weightbearing with use of his walker.  Patient should continue Polar Care and elevation consistently at home to reduce swelling.  Patient will follow-up in the office in 7 to 10 days.  He will continue Lovenox 40 mg injection daily for DVT prophylaxis.  Patient will be set up for home  health PT and OT.   Driving restrictions   Complete by: As directed    No driving for 1-618-12 weeks.   Increase activity slowly as tolerated   Complete by: As directed    Lifting restrictions   Complete by: As directed    No lifting for 16 weeks         Signed: Juanell FairlyKevin Terius Jacuinde ,MD 08/23/2018, 1:29 PM

## 2018-08-23 NOTE — TOC Transition Note (Signed)
Transition of Care [facility]) - CM/SW Discharge Note   Patient Details  Name: Kevin Petty MRN: [id number] Date of Birth: Feb 19, 1955  [facility] CM/SW Contact:  Kevin Petty, Kevin Petty Phone Number: [phone]  08/23/2018, 2:12 PM   Clinical Narrative: Per RN patient has changed his mind and would like home health PT instead of outpatient PT. Clinical Social Worker (CSW) met with patient and his wife Kevin Petty was at bedside. CSW presented CMS home health to patient. Patient prefers [facility] and stated that he knows the Freight forwarder of [facility] Kevin Petty. Per Kevin Petty [facility] home health representative they can accept patient. Kevin Petty is aware that patient will D/C today. Patient has no DME needs. RN aware of above. Please reconsult if future social work needs arise. CSW signing off.     Final next level of care: [place] Barriers to Discharge: Barriers Resolved   Patient Goals and CMS Choice Patient states their goals for this hospitalization and ongoing recovery are:: To go home. CMS Medicare.gov Compare Post Acute Care list provided to:: Patient Choice offered to / list presented to : Patient  Discharge Placement                       Discharge Plan and Services   Discharge Planning Services: CM Consult            DME Arranged: N/A         HH Arranged: PT, OT, Nurse's Aide [place] Agency: [facility] (formerly [employer]) Date [place]: 08/23/18 Time [place]: [phone] Representative spoke with at [place]: Kevin Petty ([place]) Interventions     Readmission Risk Interventions No flowsheet data found.

## 2018-08-23 NOTE — Evaluation (Signed)
Occupational Therapy Evaluation Patient Details Name: Kevin Petty MRN: [id number] DOB: 01/02/1956 Today's Date: 08/23/2018    History of Present Illness 63 yo male admitted s/p right quadriceps tendon repair after slipping down stairs, PMH includes HTN   Clinical Impression   Kevin Petty was seen for OT evaluation this date, POD#1 from above surgery. Pt was independent in all ADLs prior to surgery. He reports driving, working, and [facility]. Pt is eager to return to PLOF with less pain and improved safety and independence. Pt currently requires moderate assist for LB dressing while in seated position due to pain and limited AROM of RLE. Pt instructed in polar care mgt, falls prevention strategies, home/routines modifications, DME/AE for LB bathing and dressing tasks, and compression stocking mgt. Handout provided. Pt would benefit from skilled OT services including additional instruction in dressing techniques with or without assistive devices for dressing and bathing skills to support recall and carryover prior to discharge and ultimately to maximize safety, independence, and minimize falls risk and caregiver burden. Recommend HHOT upon hospital DC to maximize pt safety and functional independence during meaningful occupations of daily life within his natural context.       Follow Up Recommendations  Home health OT    Equipment Recommendations  None recommended by OT(Pt has bari-BSC in the home.)    Recommendations for Other Services       Precautions / Restrictions Precautions Precautions: Fall Required Braces or Orthoses: Knee Immobilizer - Right Knee Immobilizer - Right: On at all times Restrictions Weight Bearing Restrictions: Yes RLE Weight Bearing: Partial weight bearing RLE Partial Weight Bearing Percentage or Pounds: 25      Mobility Bed Mobility Overal bed mobility: Needs Assistance Bed Mobility: Supine to Sit     Supine to sit: Min  guard     General bed mobility comments: pt utilized bed rails and LLE to help move RLE to sit EOB, HOB elevated however pt will be sleeping in recliner initially, pt required increased time and effort to sit EOB, pt reports having a high bed at home  Transfers Overall transfer level: Needs assistance Equipment used: Rolling walker (2 wheeled) Transfers: Sit to/from Stand Sit to Stand: Min guard         General transfer comment: min guard for safety, pt required cuing for hand placement and maintaining WB precautions    Balance Overall balance assessment: Mild deficits observed, not formally tested Sitting-balance support: No upper extremity supported;Feet supported Sitting balance-Leahy Scale: Good Sitting balance - Comments: Steady reaching in/outside BOS. Good static and dynamic sitting balance when leaning over in chair to reach objects outside of BOS.     Standing balance-Leahy Scale: Fair Standing balance comment: pt able to maintain balance while handwashing and while using restroom                           ADL either performed or assessed with clinical judgement   ADL Overall ADL's : Needs assistance/impaired                                       General ADL Comments: Mod A for LB Bathing and dressing while seated. KI limits ability to dress RLE at this time. Pt reports using AE for dressing and ADL mgt for 1 week prior to sx. Uses bari RW for functional  mobility. Req. SBA for mgt of lines/leads.     Vision         Perception     Praxis      Pertinent Vitals/Pain Pain Assessment: Faces Faces Pain Scale: Hurts little more Pain Location: right knee, increases with movement. Pain Descriptors / Indicators: Sore;Grimacing;Guarding Pain Intervention(s): Monitored during session;Limited activity within patient's tolerance;Repositioned     Hand Dominance     Extremity/Trunk Assessment Upper Extremity Assessment Upper Extremity  Assessment: Overall WFL for tasks assessed(Strenght/ROM WNL BUE.)   Lower Extremity Assessment Lower Extremity Assessment: Defer to PT evaluation;RLE deficits/detail RLE Deficits / Details: weakness secondary to recent surgery RLE: Unable to fully assess due to pain;Unable to fully assess due to immobilization RLE Coordination: decreased fine motor;decreased gross motor   Cervical / Trunk Assessment Cervical / Trunk Assessment: Normal   Communication Communication Communication: No difficulties   Cognition Arousal/Alertness: Awake/alert Behavior During Therapy: WFL for tasks assessed/performed Overall Cognitive Status: Within Functional Limits for tasks assessed                                 General Comments: talkative   General Comments       Exercises Total Joint Exercises Quad Sets: AROM;Strengthening;Right;10 reps;Supine Other Exercises Other Exercises: Pt educated on falls prevention strategies, safe use of AE for LB ADL tasks and functional mobility, polar care mgt, and WB precautions on this date. Handout provided. Would benefit from reinforcement.   Shoulder Instructions      Home Living Family/patient expects to be discharged to:: Private residence Living Arrangements: Spouse/significant other Available Help at Discharge: Family;Available PRN/intermittently(Spouse works nights.) Type of Home: House Home Access: Stairs to enter Entergy CorporationEntrance Stairs-Number of Steps: 4 Entrance Stairs-Rails: Can reach both Home Layout: Two level;Bed/bath upstairs;Able to live on main level with bedroom/bathroom Alternate Level Stairs-Number of Steps: Flight W/ platform turn in middle.   Bathroom Shower/Tub: Producer, television/film/videoWalk-in shower   Bathroom Toilet: Standard     Home Equipment: Environmental consultantWalker - 2 wheels;Bedside commode;Cane - single point          Prior Functioning/Environment Level of Independence: Independent with assistive device(s)        Comments: pt reports ambulating  around home with cane as needed        OT Problem List: Decreased strength;Decreased coordination;Decreased range of motion;Decreased activity tolerance;Decreased safety awareness;Decreased knowledge of use of DME or AE;Obesity;Decreased knowledge of precautions;Impaired balance (sitting and/or standing)      OT Treatment/Interventions: Self-care/ADL training;Therapeutic exercise;Therapeutic activities;Balance training;DME and/or AE instruction;Patient/family education    OT Goals(Current goals can be found in the care plan section) Acute Rehab OT Goals Patient Stated Goal: return home OT Goal Formulation: With patient Time For Goal Achievement: 09/06/18 Potential to Achieve Goals: Good ADL Goals Pt Will Perform Lower Body Bathing: sitting/lateral leans;with modified independence;with adaptive equipment(With LRAD PRN for improved safety & functional independence.) Pt Will Perform Lower Body Dressing: sit to/from stand;with min assist;with adaptive equipment(With LRAD PRN for improved safety & functional independence.) Pt Will Transfer to Toilet: ambulating;bedside commode;with modified independence(With LRAD/DME PRN for improved safety and functional independence.)  OT Frequency: Min 1X/week   Barriers to D/C:            Co-evaluation              AM-PAC OT "6 Clicks" Daily Activity     Outcome Measure Help from another person eating meals?: None Help from another person  taking care of personal grooming?: None Help from another person toileting, which includes using toliet, bedpan, or urinal?: A Lot Help from another person bathing (including washing, rinsing, drying)?: A Lot Help from another person to put on and taking off regular upper body clothing?: None Help from another person to put on and taking off regular lower body clothing?: A Lot 6 Click Score: 18   End of Session Equipment Utilized During Treatment: Gait belt;Rolling walker  Activity Tolerance: Patient  tolerated treatment well Patient left: in chair;with call bell/phone within reach;with chair alarm set;with SCD's reapplied  OT Visit Diagnosis: Other abnormalities of gait and mobility (R26.89);History of falling (Z91.81)                Time: 1010-1053 OT Time Calculation (min): 43 min Charges:  OT General Charges $OT Visit: 1 Visit OT Evaluation $OT Eval Low Complexity: 1 Low OT Treatments $Self Care/Home Management : 23-37 mins  Shara Blazing, M.S., OTR/L Ascom: [phone] 08/23/18, 12:07 PM

## 2018-08-23 NOTE — Progress Notes (Signed)
Subjective:  POD #1 s/p open right quadriceps tendon repair.   Patient reports right knee pain as mild.  Patient is up out of bed to a chair.  His wife is at the bedside.  Patient has not yet had a bowel movement.  Objective:   VITALS:   Vitals:   08/22/18 2159 08/23/18 0024 08/23/18 0332 08/23/18 0832  BP: 132/86 (!) 149/89 140/84 130/86  Pulse: 72 75 74 77  Resp: 15 16 15 20   Temp: (!) 97.5 F (36.4 C) 97.6 F (36.4 C) 98.2 F (36.8 C) 98.2 F (36.8 C)  TempSrc: Oral Oral Oral Oral  SpO2: 100% 100% 98% 99%  Weight:      Height:        PHYSICAL EXAM: Right lower extremity Neurovascular intact Sensation intact distally Intact pulses distally Dorsiflexion/Plantar flexion intact Incision: dressing C/D/I Compartment soft  LABS  Results for orders placed or performed during the hospital encounter of 08/22/18 (from the past 24 hour(s))  CBC     Status: Abnormal   Collection Time: 08/23/18  4:51 AM  Result Value Ref Range   WBC 10.7 (H) 4.0 - 10.5 K/uL   RBC 4.56 4.22 - 5.81 MIL/uL   Hemoglobin 13.4 13.0 - 17.0 g/dL   HCT 39.1 39.0 - 52.0 %   MCV 85.7 80.0 - 100.0 fL   MCH 29.4 26.0 - 34.0 pg   MCHC 34.3 30.0 - 36.0 g/dL   RDW 12.0 11.5 - 15.5 %   Platelets 330 150 - 400 K/uL   nRBC 0.0 0.0 - 0.2 %  Basic metabolic panel     Status: Abnormal   Collection Time: 08/23/18  4:51 AM  Result Value Ref Range   Sodium 133 (L) 135 - 145 mmol/L   Potassium 3.7 3.5 - 5.1 mmol/L   Chloride 100 98 - 111 mmol/L   CO2 26 22 - 32 mmol/L   Glucose, Bld 198 (H) 70 - 99 mg/dL   BUN 18 8 - 23 mg/dL   Creatinine, Ser 0.87 0.61 - 1.24 mg/dL   Calcium 8.8 (L) 8.9 - 10.3 mg/dL   GFR calc non Af Amer >60 >60 mL/min   GFR calc Af Amer >60 >60 mL/min   Anion gap 7 5 - 15    Dg Knee Right Port  Result Date: 08/22/2018 CLINICAL DATA:  Quadriceps tendon repair EXAM: PORTABLE RIGHT KNEE - 1-2 VIEW COMPARISON:  08/13/2018 FINDINGS: Interval postoperative changes from quadriceps tendon  repair. Interval removal of large ossific fragment superior to the patella. Alignment is anatomic. 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 Expected postoperative changes within the anterior soft tissues. IMPRESSION: Interval postoperative changes from quadriceps tendon repair. No radiographic evidence of postoperative complication. Electronically Signed   By: Davina Poke M.D.   On: 08/22/2018 15:59    Assessment/Plan: 1 Day Post-Op   Active Problems:   Quadriceps muscle rupture, right, initial encounter  Patient has been evaluated by physical and Occupational Therapy.  They recommended home health services.  Patient's pain is greatly improved overnight and is ready for discharge home.  Patient will receive medication to help promote having a bowel movement.  I have also recommended he get a stool softener and laxative while he is on narcotic pain medication at home.  Patient will follow-up with me in 1 week.  He will remain 25% partial weightbearing on the right lower extremity until follow-up.  Patient will continue Lovenox 40 mg daily for DVT prophylaxis given his history  of previous DVT.  Patient remained on this medication for 4 weeks postop.   Juanell FairlyKevin Terresa Marlett , MD 08/23/2018, 1:12 PM

## 2018-08-23 NOTE — Evaluation (Signed)
Physical Therapy Evaluation Patient Details Name: Kevin Petty MRN: [id number] DOB: September 18, 1955 Today's Date: 08/23/2018   History of Present Illness  63 yo male admitted s/p right quadriceps tendon repair after slipping down stairs, PMH includes HTN  Clinical Impression  Pt is a 63 yo male admitted for above. Pt in bed upon arrival and agreed to participate with PT.  Pt presents with decreased strength, endurance and balance secondary to recent surgery. Pt educated on weight bearing precautions and keeping knee immobilizer on with pt verbalizing understanding. Pt good with maintaining WB precautions throughout session requiring minimal cuing. Pt required cuing throughout gait and stair training for maintaining PWB status. Pt ambulated to/from bathroom and rehab gym with no complaints of increased pain or LOB with min guard assist for safety. Pt ambulated up/down 4 steps with right handrail and cane with step to pattern and min guard assist for safety and required cuing for maintaining WB precautions. Pt would benefit from skilled acute therapy to improve strength, endurance and ensure compliance of WB status during all functional activities. Pt would benefit from home health PT post discharge to further improve strength, endurance, ROM and balance to improve functional mobility to allow for return to PLOF. Pt educated on recommendation.     Follow Up Recommendations Home health PT    Equipment Recommendations  None recommended by PT    Recommendations for Other Services       Precautions / Restrictions Precautions Precautions: Fall Required Braces or Orthoses: Knee Immobilizer - Right Knee Immobilizer - Right: On at all times Restrictions Weight Bearing Restrictions: Yes RLE Weight Bearing: Partial weight bearing RLE Partial Weight Bearing Percentage or Pounds: 25      Mobility  Bed Mobility Overal bed mobility: Needs Assistance Bed Mobility: Supine to Sit     Supine to sit:  Min guard     General bed mobility comments: pt utilized bed rails and LLE to help move RLE to sit EOB, HOB elevated however pt will be sleeping in recliner initially, pt required increased time and effort to sit EOB, pt reports having a high bed at home  Transfers Overall transfer level: Needs assistance Equipment used: Rolling walker (2 wheeled) Transfers: Sit to/from Stand Sit to Stand: Min guard         General transfer comment: min guard for safety, pt required cuing for hand placement and maintaining WB precautions  Ambulation/Gait Ambulation/Gait assistance: Min guard Gait Distance (Feet): 50 Feet Assistive device: Rolling walker (2 wheeled) Gait Pattern/deviations: Step-to pattern;Decreased weight shift to right;Antalgic Gait velocity: decreased   General Gait Details: pt ambulated in room to/from bathroom and to rehab gym before a sitting rest break, pt then performed stair training and ambulated back to room, pt mildly antalgic requiring cuing for maintaing PWB status, pt had no instances of unsteadiness or LOB  Stairs Stairs: Yes Stairs assistance: Min guard Stair Management: One rail Right;Step to pattern;With cane Number of Stairs: 4 General stair comments: pt educated on up with the good down with the bad pattern for stair navigation, pt required cuing for maintaining PWB status, pt performed much better going down, pt safe with close guarding for safety and no instances of LOB or unsteadiness  Wheelchair Mobility    Modified Rankin (Stroke Patients Only)       Balance Overall balance assessment: Mild deficits observed, not formally tested Sitting-balance support: Single extremity supported Sitting balance-Leahy Scale: Good       Standing balance-Leahy Scale: Fair Standing balance  comment: pt able to maintain balance while handwashing and while using restroom                             Pertinent Vitals/Pain Pain Assessment: Faces Faces  Pain Scale: Hurts a little bit Pain Location: right knee Pain Descriptors / Indicators: Sore Pain Intervention(s): Monitored during session;Limited activity within patient's tolerance    Home Living Family/patient expects to be discharged to:: Private residence Living Arrangements: Spouse/significant other Available Help at Discharge: Family;Available PRN/intermittently(spouse works nights) Type of Home: House Home Access: Stairs to enter Entrance Stairs-Rails: Can reach both Entrance Stairs-Number of Steps: 4 Home Layout: Two level;Bed/bath upstairs;Able to live on main level with bedroom/bathroom Home Equipment: Dan HumphreysWalker - 2 wheels;Bedside commode;Cane - single point      Prior Function Level of Independence: Independent with assistive device(s)         Comments: pt reports ambulating around home with cane as needed     Hand Dominance        Extremity/Trunk Assessment   Upper Extremity Assessment Upper Extremity Assessment: Defer to OT evaluation    Lower Extremity Assessment Lower Extremity Assessment: RLE deficits/detail RLE Deficits / Details: weakness secondary to recent surgery    Cervical / Trunk Assessment Cervical / Trunk Assessment: Normal  Communication   Communication: No difficulties  Cognition Arousal/Alertness: Awake/alert Behavior During Therapy: WFL for tasks assessed/performed Overall Cognitive Status: Within Functional Limits for tasks assessed                                        General Comments      Exercises Total Joint Exercises Quad Sets: AROM;Strengthening;Right;10 reps;Supine   Assessment/Plan    PT Assessment Patient needs continued PT services  PT Problem List Decreased mobility;Decreased strength;Decreased safety awareness;Decreased range of motion;Decreased knowledge of precautions;Decreased activity tolerance;Decreased balance       PT Treatment Interventions DME instruction;Therapeutic exercise;Gait  training;Balance training;Stair training;Neuromuscular re-education;Functional mobility training;Therapeutic activities;Patient/family education    PT Goals (Current goals can be found in the Care Plan section)  Acute Rehab PT Goals Patient Stated Goal: return home PT Goal Formulation: With patient Time For Goal Achievement: 08/30/18 Potential to Achieve Goals: Good    Frequency BID   Barriers to discharge Decreased caregiver support;Inaccessible home environment;Other (comment) spouse works nights, bedroom upstairs    Co-evaluation               AM-PAC PT "6 Clicks" Mobility  Outcome Measure Help needed turning from your back to your side while in a flat bed without using bedrails?: A Little Help needed moving from lying on your back to sitting on the side of a flat bed without using bedrails?: A Little Help needed moving to and from a bed to a chair (including a wheelchair)?: A Little Help needed standing up from a chair using your arms (e.g., wheelchair or bedside chair)?: A Little Help needed to walk in hospital room?: A Little Help needed climbing 3-5 steps with a railing? : A Little 6 Click Score: 18    End of Session Equipment Utilized During Treatment: Gait belt;Right knee immobilizer Activity Tolerance: Patient tolerated treatment well Patient left: in chair;with call bell/phone within reach;Other (comment)(OT in room at end of session) Nurse Communication: Mobility status PT Visit Diagnosis: Other abnormalities of gait and mobility (R26.89);Difficulty in walking, not elsewhere classified (  R26.2);Muscle weakness (generalized) (M62.81)    Time: [id number] PT Time Calculation (min) (ACUTE ONLY): 45 min   Charges:   PT Evaluation $PT Eval Low Complexity: 1 Low PT Treatments $Gait Training: 8-22 mins $Therapeutic Activity: 8-22 mins        Meghan Tiemann PT, DPT 11:43 AM,08/23/18 [phone]

## 2018-08-23 NOTE — TOC Transition Note (Signed)
Transition of Care [facility]) - CM/SW Discharge Note   Patient Details  Name: JAVIS ABBOUD MRN: [id number] Date of Birth: 11/18/1955  [facility] CM/SW Contact:  Su Hilt, RN Phone Number: 08/23/2018, 11:40 AM   Clinical Narrative:    Patient will DC home with his wife and she provides transportation, He has a RW at home and does not need additional DME, He will go to Outpatient PT and wants to set up his own appointment He is up to date with his PCP and can afford his medications, no other needs at this time   Final next level of care: Home/Self Care Barriers to Discharge: Barriers Resolved   Patient Goals and CMS Choice Patient states their goals for this hospitalization and ongoing recovery are:: go home with his wife      Discharge Placement                       Discharge Plan and Services   Discharge Planning Services: CM Consult            DME Arranged: N/A         HH Arranged: NA          Social Determinants of Health (SDOH) Interventions     Readmission Risk Interventions No flowsheet data found.

## 2018-08-23 NOTE — Progress Notes (Signed)
Discharge education provided to patient. Patient's wife present. Patient nor wife reported any questions. Lupita Leash

## 2020-09-04 HISTORY — PX: ELBOW SURGERY: SHX618

## 2020-09-29 ENCOUNTER — Other Ambulatory Visit: Payer: Self-pay | Admitting: Orthopaedic Surgery

## 2021-01-22 ENCOUNTER — Encounter: Payer: Self-pay | Admitting: *Deleted

## 2021-01-23 ENCOUNTER — Other Ambulatory Visit: Payer: Self-pay

## 2021-01-23 ENCOUNTER — Ambulatory Visit: Payer: BC Managed Care – PPO | Admitting: Certified Registered Nurse Anesthetist

## 2021-01-23 ENCOUNTER — Ambulatory Visit
Admission: RE | Admit: 2021-01-23 | Discharge: 2021-01-23 | Disposition: A | Discharge status: Home / Self Care | Payer: BC Managed Care – PPO | Attending: Gastroenterology | Admitting: Gastroenterology

## 2021-01-23 ENCOUNTER — Encounter: Payer: Self-pay | Admitting: *Deleted

## 2021-01-23 ENCOUNTER — Encounter
Admission: RE | Disposition: A | Discharge status: Home / Self Care | Payer: Self-pay | Source: Home / Self Care | Attending: Gastroenterology

## 2021-01-23 DIAGNOSIS — N189 Chronic kidney disease, unspecified: Secondary | ICD-10-CM | POA: Diagnosis not present

## 2021-01-23 DIAGNOSIS — K640 First degree hemorrhoids: Secondary | ICD-10-CM | POA: Insufficient documentation

## 2021-01-23 DIAGNOSIS — Z1211 Encounter for screening for malignant neoplasm of colon: Secondary | ICD-10-CM | POA: Diagnosis present

## 2021-01-23 DIAGNOSIS — Z800 Family history of malignant neoplasm of digestive organs: Secondary | ICD-10-CM | POA: Insufficient documentation

## 2021-01-23 DIAGNOSIS — G709 Myoneural disorder, unspecified: Secondary | ICD-10-CM | POA: Diagnosis not present

## 2021-01-23 DIAGNOSIS — I129 Hypertensive chronic kidney disease with stage 1 through stage 4 chronic kidney disease, or unspecified chronic kidney disease: Secondary | ICD-10-CM | POA: Insufficient documentation

## 2021-01-23 HISTORY — DX: Disorder of kidney and ureter, unspecified: N28.9

## 2021-01-23 HISTORY — PX: COLONOSCOPY WITH PROPOFOL: SHX5780

## 2021-01-23 SURGERY — COLONOSCOPY WITH PROPOFOL
Anesthesia: General

## 2021-01-23 MED ORDER — PHENYLEPHRINE 40 MCG/ML (10ML) SYRINGE FOR IV PUSH (FOR BLOOD PRESSURE SUPPORT)
PREFILLED_SYRINGE | INTRAVENOUS | Status: DC | PRN
  Administered 2021-01-23: 80 ug via INTRAVENOUS
  Administered 2021-01-23 (×2): 160 ug via INTRAVENOUS
  Administered 2021-01-23: 80 ug via INTRAVENOUS

## 2021-01-23 MED ORDER — SODIUM CHLORIDE 0.9 % IV SOLN: INTRAVENOUS | Status: DC

## 2021-01-23 MED ORDER — PROPOFOL 10 MG/ML IV BOLUS
INTRAVENOUS | Status: DC | PRN
Start: 2021-01-23 — End: 2021-01-23
  Administered 2021-01-23: 70 mg via INTRAVENOUS

## 2021-01-23 MED ORDER — EPHEDRINE SULFATE (PRESSORS) 50 MG/ML IJ SOLN
INTRAMUSCULAR | Status: DC | PRN
  Administered 2021-01-23: 5 mg via INTRAVENOUS

## 2021-01-23 MED ORDER — LIDOCAINE HCL (CARDIAC) PF 100 MG/5ML IV SOSY
PREFILLED_SYRINGE | INTRAVENOUS | Status: DC | PRN
Start: 2021-01-23 — End: 2021-01-23
  Administered 2021-01-23: 100 mg via INTRAVENOUS

## 2021-01-23 MED ORDER — PROPOFOL 500 MG/50ML IV EMUL
INTRAVENOUS | Status: DC | PRN
Start: 2021-01-23 — End: 2021-01-23
  Administered 2021-01-23: 140 ug/kg/min via INTRAVENOUS

## 2021-01-23 NOTE — Anesthesia Procedure Notes (Signed)
Date/Time: 01/23/2021 8:27 AM Performed by: Joanette Gula, Wandalene Abrams, CRNA Pre-anesthesia Checklist: Patient identified, Emergency Drugs available, Suction available, Patient being monitored and Timeout performed Patient Re-evaluated:Patient Re-evaluated prior to induction Oxygen Delivery Method: Nasal cannula Induction Type: IV induction

## 2021-01-23 NOTE — Op Note (Signed)
[facility] Gastroenterology Patient Name: Kevin Petty Procedure Date: 01/23/2021 8:16 AM MRN: [id number] Account #: [id number] Date of Birth: [birth year] Admit Type: Outpatient Age: 66 Room: [facility] ENDO ROOM 3 Gender: Male Note Status: Finalized Instrument Name: Jasper Riling [id number] Procedure:             Colonoscopy Indications:           Screening in patient at increased risk: Family history                         of 1st-degree relative with colorectal cancer before                         age 17 years Providers:             Andrey Farmer MD, MD Referring MD:          Baxter Hire, MD (Referring MD) Medicines:             Monitored Anesthesia Care Complications:         No immediate complications. Estimated blood loss:                         Minimal. Procedure:             Pre-Anesthesia Assessment:                        - Prior to the procedure, a History and Physical was                         performed, and patient medications and allergies were                         reviewed. The patient is competent. The risks and                         benefits of the procedure and the sedation options and                         risks were discussed with the patient. All questions                         were answered and informed consent was obtained.                         Patient identification and proposed procedure were                         verified by the physician, the nurse, the                         anesthesiologist, the anesthetist and the technician                         in the endoscopy suite. Mental Status Examination:                         alert and oriented. Airway Examination: normal  oropharyngeal airway and neck mobility. Respiratory                         Examination: clear to auscultation. CV Examination:                         normal. Prophylactic Antibiotics: The patient does not                          require prophylactic antibiotics. Prior                         Anticoagulants: The patient has taken no previous                         anticoagulant or antiplatelet agents. ASA Grade                         Assessment: II - A patient with mild systemic disease.                         After reviewing the risks and benefits, the patient                         was deemed in satisfactory condition to undergo the                         procedure. The anesthesia plan was to use monitored                         anesthesia care (MAC). Immediately prior to                         administration of medications, the patient was                         re-assessed for adequacy to receive sedatives. The                         heart rate, respiratory rate, oxygen saturations,                         blood pressure, adequacy of pulmonary ventilation, and                         response to care were monitored throughout the                         procedure. The physical status of the patient was                         re-assessed after the procedure.                        After obtaining informed consent, the colonoscope was                         passed under direct vision. Throughout the procedure,  the patient's blood pressure, pulse, and oxygen                         saturations were monitored continuously. The                         Colonoscope was introduced through the anus and                         advanced to the the cecum, identified by appendiceal                         orifice and ileocecal valve. The colonoscopy was                         somewhat difficult due to significant looping. The                         patient tolerated the procedure well. The quality of                         the bowel preparation was adequate to identify polyps. Findings:      The perianal and digital rectal examinations were normal.      A 1 mm polyp was found in the  cecum. The polyp was sessile. The polyp       was removed with a jumbo cold forceps. Resection and retrieval were       complete. Estimated blood loss was minimal.      Internal hemorrhoids were found during retroflexion. The hemorrhoids       were Grade I (internal hemorrhoids that do not prolapse).      The exam was otherwise without abnormality on direct and retroflexion       views. Impression:            - One 1 mm polyp in the cecum, removed with a jumbo                         cold forceps. Resected and retrieved.                        - Internal hemorrhoids.                        - The examination was otherwise normal on direct and                         retroflexion views. Recommendation:        - Discharge patient to home.                        - Resume previous diet.                        - Continue present medications.                        - Await pathology results.                        - Repeat colonoscopy in 5 years for  surveillance.                        - Return to referring physician as previously                         scheduled. Procedure Code(s):     --- Professional ---                        [phone], Colonoscopy, flexible; with biopsy, single or                         multiple Diagnosis Code(s):     --- Professional ---                        Z80.0, Family history of malignant neoplasm of                         digestive organs                        K63.5, Polyp of colon                        K64.0, First degree hemorrhoids CPT copyright 2019 [employer]. All rights reserved. The codes documented in this report are preliminary and upon coder review may  be revised to meet current compliance requirements. Andrey Farmer MD, MD 01/23/2021 8:58:44 AM Number of Addenda: 0 Note Initiated On: 01/23/2021 8:16 AM Scope Withdrawal Time: 0 hours 15 minutes 52 seconds  Total Procedure Duration: 0 hours 22 minutes 59 seconds  Estimated Blood  Loss:  Estimated blood loss was minimal.      [facility]

## 2021-01-23 NOTE — Interval H&P Note (Signed)
History and Physical Interval Note:  01/23/2021 8:23 AM  Kevin Petty.  has presented today for surgery, with the diagnosis of Z80.0 Family History of Colon Cancer.  The various methods of treatment have been discussed with the patient and family. After consideration of risks, benefits and other options for treatment, the patient has consented to  Procedure(s) with comments: COLONOSCOPY WITH PROPOFOL (N/A) - OFFICE STATES PATIENT NEEDS CLINDAMYCIN as a surgical intervention.  The patient's history has been reviewed, patient examined, no change in status, stable for surgery.  I have reviewed the patient's chart and labs.  Questions were answered to the patient's satisfaction.     Regis Bill  Ok to proceed with colonoscopy

## 2021-01-23 NOTE — H&P (Signed)
Outpatient short stay form Pre-procedure 01/23/2021  Regis Bill, MD  Primary Physician: Gracelyn Nurse, MD  Reason for visit:  Screening  History of present illness:    66 y/o gentleman with history of hypertension and multiple joint replacements here for screening colonoscopy. Last colonoscopy was > 2010. Mother had colon cancer at age 34. No abdominal surgeries. No blood thinners.    Current Facility-Administered Medications:    0.9 %  sodium chloride infusion, , Intravenous, Continuous, Cordera Stineman, Rossie Muskrat, MD, Last Rate: 20 mL/hr at 01/23/21 0747, New Bag at 01/23/21 0747  Medications Prior to Admission  Medication Sig Dispense Refill Last Dose   allopurinol (ZYLOPRIM) 300 MG tablet Take 450 mg by mouth daily.   01/22/2021   gemfibrozil (LOPID) 600 MG tablet Take 600 mg by mouth 2 (two) times daily before a meal.   01/22/2021   hydrochlorothiazide (HYDRODIURIL) 25 MG tablet Take 12.5 mg by mouth daily.   01/22/2021   acetaminophen (TYLENOL) 325 MG tablet Take 1-2 tablets (325-650 mg total) by mouth every 6 (six) hours as needed for mild pain (pain score 1-3 or temp > 100.5). 60 tablet 1    docusate sodium (COLACE) 100 MG capsule Take 1 capsule (100 mg total) by mouth 2 (two) times daily. 10 capsule 0    doxycycline (VIBRAMYCIN) 100 MG capsule Take 100 mg by mouth 2 (two) times daily as needed (rosacea flare).      enoxaparin (LOVENOX) 40 MG/0.4ML injection Inject 0.4 mLs (40 mg total) into the skin daily for 30 doses. 12 mL 0    methocarbamol (ROBAXIN) 500 MG tablet Take 1 tablet (500 mg total) by mouth every 6 (six) hours as needed for muscle spasms. 30 tablet 0    oxyCODONE (OXY IR/ROXICODONE) 5 MG immediate release tablet Take 1-2 tablets (5-10 mg total) by mouth every 4 (four) hours as needed for moderate pain (pain score 4-6). 30 tablet 0      Allergies  Allergen Reactions   Penicillins Rash    Did it involve swelling of the face/tongue/throat, SOB, or low BP? No Did  it involve sudden or severe rash/hives, skin peeling, or any reaction on the inside of your mouth or nose? No Did you need to seek medical attention at a hospital or doctor's office? Unknown When did it last happen?      childhood allergy If all above answers are "NO", may proceed with cephalosporin use.      Past Medical History:  Diagnosis Date   Hypertension    Renal insufficiency     Review of systems:  Otherwise negative.    Physical Exam  Gen: Alert, oriented. Appears stated age.  HEENT: PERRLA. Lungs: No respiratory distress CV: RRR Abd: soft, benign, no masses Ext: No edema    Planned procedures: Proceed with colonoscopy. The patient understands the nature of the planned procedure, indications, risks, alternatives and potential complications including but not limited to bleeding, infection, perforation, damage to internal organs and possible oversedation/side effects from anesthesia. The patient agrees and gives consent to proceed.  Please refer to procedure notes for findings, recommendations and patient disposition/instructions.     Regis Bill, MD [facility]astroenterology

## 2021-01-23 NOTE — Anesthesia Preprocedure Evaluation (Signed)
Anesthesia Evaluation  Patient identified by MRN, date of birth, ID band Patient awake    Reviewed: Allergy & Precautions, NPO status , Patient's Chart, lab work & pertinent test results  History of Anesthesia Complications Negative for: history of anesthetic complications  Airway Mallampati: III  TM Distance: <3 FB Neck ROM: full    Dental  (+) Chipped, Poor Dentition, Missing   Pulmonary neg pulmonary ROS, neg shortness of breath,    Pulmonary exam normal        Cardiovascular Exercise Tolerance: Good hypertension, (-) angina(-) Past MI Normal cardiovascular exam     Neuro/Psych  Neuromuscular disease negative psych ROS   GI/Hepatic negative GI ROS, Neg liver ROS, neg GERD  ,  Endo/Other  negative endocrine ROS  Renal/GU Renal disease  negative genitourinary   Musculoskeletal   Abdominal   Peds  Hematology negative hematology ROS (+)   Anesthesia Other Findings Past Medical History: No date: Hypertension No date: Renal insufficiency  Past Surgical History: 09/2020: ELBOW SURGERY No date: JOINT REPLACEMENT; Bilateral     Comment:  total hip replacement, 2010, 2015 08/22/2018: QUADRICEPS TENDON REPAIR; Right     Comment:  Procedure: REPAIR QUADRICEP TENDON;  Surgeon: Juanell Fairly, MD;  Location: [facility] ORS;  Service: Orthopedics;                Laterality: Right;  BMI    Body Mass Index: 37.50 kg/m      Reproductive/Obstetrics negative OB ROS                             Anesthesia Physical Anesthesia Plan  ASA: 3  Anesthesia Plan: General   Post-op Pain Management:    Induction: Intravenous  PONV Risk Score and Plan: Propofol infusion and TIVA  Airway Management Planned: Natural Airway and Nasal Cannula  Additional Equipment:   Intra-op Plan:   Post-operative Plan:   Informed Consent: I have reviewed the patients History and Physical, chart, labs  and discussed the procedure including the risks, benefits and alternatives for the proposed anesthesia with the patient or authorized representative who has indicated his/her understanding and acceptance.     Dental Advisory Given  Plan Discussed with: Anesthesiologist, CRNA and Surgeon  Anesthesia Plan Comments: (Patient consented for risks of anesthesia including but not limited to:  - adverse reactions to medications - risk of airway placement if required - damage to eyes, teeth, lips or other oral mucosa - nerve damage due to positioning  - sore throat or hoarseness - Damage to heart, brain, nerves, lungs, other parts of body or loss of life  Patient voiced understanding.)        Anesthesia Quick Evaluation

## 2021-01-23 NOTE — Anesthesia Postprocedure Evaluation (Signed)
Anesthesia Post Note  Patient: Kevin Petty.  Procedure(s) Performed: COLONOSCOPY WITH PROPOFOL  Patient location during evaluation: Endoscopy Anesthesia Type: General Level of consciousness: awake and alert Pain management: pain level controlled Vital Signs Assessment: post-procedure vital signs reviewed and stable Respiratory status: spontaneous breathing, nonlabored ventilation, respiratory function stable and patient connected to nasal cannula oxygen Cardiovascular status: blood pressure returned to baseline and stable Postop Assessment: no apparent nausea or vomiting Anesthetic complications: no   No notable events documented.   Last Vitals:  Vitals:   01/23/21 0908 01/23/21 0918  BP: 99/64 128/84  Pulse: 76 70  Resp: 17 20  Temp:    SpO2: 96% 99%    Last Pain:  Vitals:   01/23/21 0918  TempSrc:   PainSc: 0-No pain                 Cleda Mccreedy [name]

## 2021-01-23 NOTE — Transfer of Care (Signed)
Immediate Anesthesia Transfer of Care Note  Patient: Kevin Petty.  Procedure(s) Performed: COLONOSCOPY WITH PROPOFOL  Patient Location: Endoscopy Unit  Anesthesia Type:General  Level of Consciousness: sedated  Airway & Oxygen Therapy: Patient Spontanous Breathing  Post-op Assessment: Report given to RN and Post -op Vital signs reviewed and stable  Post vital signs: Reviewed and stable  Last Vitals:  Vitals Value Taken Time  BP 103/64 01/23/21 0858  Temp    Pulse 82 01/23/21 0859  Resp 20 01/23/21 0859  SpO2 96 % 01/23/21 0859  Vitals shown include unvalidated device data.  Last Pain:  Vitals:   01/23/21 0858  TempSrc:   PainSc: 0-No pain         Complications: No notable events documented.

## 2021-01-26 ENCOUNTER — Encounter: Payer: Self-pay | Admitting: Gastroenterology

## 2021-01-26 LAB — SURGICAL PATHOLOGY
